# Patient Record
Sex: Female | Born: 1945 | Race: White | Hispanic: No | Smoking: Never smoker
Health system: Southern US, Community
[De-identification: ages and names within clinical notes are randomized; demographics above are authoritative.]

## PROBLEM LIST (undated history)

## (undated) DIAGNOSIS — F419 Anxiety disorder, unspecified: Secondary | ICD-10-CM

## (undated) DIAGNOSIS — H409 Unspecified glaucoma: Secondary | ICD-10-CM

## (undated) DIAGNOSIS — K219 Gastro-esophageal reflux disease without esophagitis: Secondary | ICD-10-CM

## (undated) DIAGNOSIS — K602 Anal fissure, unspecified: Secondary | ICD-10-CM

## (undated) DIAGNOSIS — D3501 Benign neoplasm of right adrenal gland: Secondary | ICD-10-CM

## (undated) DIAGNOSIS — M810 Age-related osteoporosis without current pathological fracture: Secondary | ICD-10-CM

## (undated) DIAGNOSIS — K635 Polyp of colon: Secondary | ICD-10-CM

## (undated) DIAGNOSIS — E079 Disorder of thyroid, unspecified: Secondary | ICD-10-CM

## (undated) DIAGNOSIS — I10 Essential (primary) hypertension: Secondary | ICD-10-CM

## (undated) DIAGNOSIS — J189 Pneumonia, unspecified organism: Secondary | ICD-10-CM

## (undated) DIAGNOSIS — K8020 Calculus of gallbladder without cholecystitis without obstruction: Secondary | ICD-10-CM

## (undated) DIAGNOSIS — M8580 Other specified disorders of bone density and structure, unspecified site: Secondary | ICD-10-CM

## (undated) DIAGNOSIS — D649 Anemia, unspecified: Secondary | ICD-10-CM

## (undated) HISTORY — PX: BLADDER NECK RECONSTRUCTION: SHX1239

## (undated) HISTORY — DX: Anemia, unspecified: D64.9

## (undated) HISTORY — DX: Anxiety disorder, unspecified: F41.9

## (undated) HISTORY — DX: Age-related osteoporosis without current pathological fracture: M81.0

## (undated) HISTORY — PX: LAPAROSCOPIC INTERNAL HERNIA REPAIR: SHX6502

## (undated) HISTORY — DX: Benign neoplasm of right adrenal gland: D35.01

## (undated) HISTORY — PX: CATARACT EXTRACTION, BILATERAL: SHX1313

## (undated) HISTORY — DX: Anal fissure, unspecified: K60.2

## (undated) HISTORY — DX: Pneumonia, unspecified organism: J18.9

## (undated) HISTORY — PX: INGUINAL HERNIA REPAIR: SUR1180

## (undated) HISTORY — PX: HERNIA REPAIR: SHX51

## (undated) HISTORY — DX: Gastro-esophageal reflux disease without esophagitis: K21.9

## (undated) HISTORY — DX: Polyp of colon: K63.5

## (undated) HISTORY — DX: Calculus of gallbladder without cholecystitis without obstruction: K80.20

## (undated) HISTORY — PX: BLADDER SUSPENSION: SHX72

---

## 1977-04-09 HISTORY — PX: ABDOMINAL HYSTERECTOMY: SHX81

## 1987-04-10 HISTORY — PX: VAGINAL PROLAPSE REPAIR: SHX830

## 1996-04-09 HISTORY — PX: CHOLECYSTECTOMY: SHX55

## 2011-07-25 ENCOUNTER — Other Ambulatory Visit: Payer: Self-pay | Admitting: Internal Medicine

## 2011-07-25 DIAGNOSIS — Z1231 Encounter for screening mammogram for malignant neoplasm of breast: Secondary | ICD-10-CM

## 2011-08-07 ENCOUNTER — Ambulatory Visit
Admission: RE | Admit: 2011-08-07 | Discharge: 2011-08-07 | Disposition: A | Discharge status: Home / Self Care | Payer: Medicare Other | Source: Ambulatory Visit | Attending: Internal Medicine | Admitting: Internal Medicine

## 2011-08-07 DIAGNOSIS — Z1231 Encounter for screening mammogram for malignant neoplasm of breast: Secondary | ICD-10-CM

## 2011-09-20 ENCOUNTER — Other Ambulatory Visit (HOSPITAL_COMMUNITY): Payer: Self-pay | Admitting: Internal Medicine

## 2011-09-20 DIAGNOSIS — M810 Age-related osteoporosis without current pathological fracture: Secondary | ICD-10-CM

## 2011-09-26 ENCOUNTER — Ambulatory Visit (HOSPITAL_COMMUNITY): Admission: RE | Admit: 2011-09-26 | Payer: Medicare Other | Source: Ambulatory Visit

## 2011-10-03 ENCOUNTER — Ambulatory Visit (HOSPITAL_COMMUNITY)
Admission: RE | Admit: 2011-10-03 | Discharge: 2011-10-03 | Disposition: A | Discharge status: Home / Self Care | Payer: Medicare Other | Source: Ambulatory Visit | Attending: Internal Medicine | Admitting: Internal Medicine

## 2011-10-03 DIAGNOSIS — Z780 Asymptomatic menopausal state: Secondary | ICD-10-CM | POA: Insufficient documentation

## 2011-10-03 DIAGNOSIS — Z13820 Encounter for screening for osteoporosis: Secondary | ICD-10-CM | POA: Insufficient documentation

## 2011-10-03 DIAGNOSIS — M810 Age-related osteoporosis without current pathological fracture: Secondary | ICD-10-CM

## 2011-10-13 ENCOUNTER — Emergency Department (HOSPITAL_COMMUNITY)
Admission: EM | Admit: 2011-10-13 | Discharge: 2011-10-13 | Disposition: A | Discharge status: Home / Self Care | Payer: Medicare Other | Attending: Emergency Medicine | Admitting: Emergency Medicine

## 2011-10-13 ENCOUNTER — Encounter (HOSPITAL_COMMUNITY): Payer: Self-pay | Admitting: Emergency Medicine

## 2011-10-13 ENCOUNTER — Emergency Department (HOSPITAL_COMMUNITY): Payer: Medicare Other

## 2011-10-13 DIAGNOSIS — M8580 Other specified disorders of bone density and structure, unspecified site: Secondary | ICD-10-CM | POA: Insufficient documentation

## 2011-10-13 DIAGNOSIS — L02419 Cutaneous abscess of limb, unspecified: Secondary | ICD-10-CM | POA: Insufficient documentation

## 2011-10-13 DIAGNOSIS — M810 Age-related osteoporosis without current pathological fracture: Secondary | ICD-10-CM | POA: Insufficient documentation

## 2011-10-13 DIAGNOSIS — L0390 Cellulitis, unspecified: Secondary | ICD-10-CM

## 2011-10-13 DIAGNOSIS — L03119 Cellulitis of unspecified part of limb: Secondary | ICD-10-CM | POA: Insufficient documentation

## 2011-10-13 HISTORY — DX: Essential (primary) hypertension: I10

## 2011-10-13 HISTORY — DX: Disorder of thyroid, unspecified: E07.9

## 2011-10-13 HISTORY — DX: Other specified disorders of bone density and structure, unspecified site: M85.80

## 2011-10-13 MED ORDER — NAPROXEN 250 MG PO TABS
375.0000 mg | ORAL_TABLET | Freq: Once | ORAL | Status: AC
  Administered 2011-10-13: 375 mg via ORAL
  Filled 2011-10-13: qty 2

## 2011-10-13 MED ORDER — CEPHALEXIN 250 MG PO CAPS: 250.0000 mg | ORAL_CAPSULE | Freq: Four times a day (QID) | ORAL | Status: AC

## 2011-10-13 MED ORDER — NAPROXEN 375 MG PO TABS: 375.0000 mg | ORAL_TABLET | Freq: Two times a day (BID) | ORAL | Status: DC

## 2011-10-13 MED ORDER — CEPHALEXIN 250 MG PO CAPS
250.0000 mg | ORAL_CAPSULE | Freq: Once | ORAL | Status: AC
  Administered 2011-10-13: 250 mg via ORAL
  Filled 2011-10-13: qty 1

## 2011-10-13 NOTE — ED Provider Notes (Signed)
History     CSN: [id number]  Arrival date & time 10/13/11  2028   First MD Initiated Contact with Patient 10/13/11 2215      Chief Complaint  Patient presents with  . Ankle Pain  . Ankle Injury    (Consider location/radiation/quality/duration/timing/severity/associated sxs/prior treatment) HPI Comments: Patient states her infant grandson when into her lateral right ankle with his walker.  Several days ago.  Since that time.  His skin has been red, swollen, and tender to the touch.  There was no breaking the skin at that she is aware of.  She is applying ice and taking over-the-counter ibuprofen, without much relief  Patient is a 66 y.o. female presenting with ankle pain and lower extremity injury. The history is provided by the patient.  Ankle Pain  The incident occurred more than 2 days ago. The incident occurred at home. The injury mechanism was a direct blow. The pain is present in the right ankle. The pain is at a severity of 3/10. The pain is mild. The pain has been constant since onset. Pertinent negatives include no numbness, no inability to bear weight, no loss of motion, no muscle weakness, no loss of sensation and no tingling.  Ankle Injury Pertinent negatives include no chills, fever, joint swelling, numbness, rash or weakness.    Past Medical History  Diagnosis Date  . Thyroid disease   . Hypertension   . Osteopenia     Past Surgical History  Procedure Date  . Abdominal hysterectomy     History reviewed. No pertinent family history.  History  Substance Use Topics  . Smoking status: Never Smoker   . Smokeless tobacco: Not on file  . Alcohol Use: No    OB History    Grav Para Term Preterm Abortions TAB SAB Ect Mult Living                  Review of Systems  Constitutional: Negative for fever and chills.  Musculoskeletal: Negative for joint swelling.  Skin: Positive for wound. Negative for pallor and rash.  Neurological: Negative for dizziness,  tingling, weakness and numbness.    Allergies  Review of patient's allergies indicates no known allergies.  Home Medications   Current Outpatient Rx  Name Route Sig Dispense Refill  . ATENOLOL 25 MG PO TABS Oral Take 25 mg by mouth daily.    Marland Kitchen CALCIUM + D PO Oral Take 2 tablets by mouth 2 (two) times daily.    Marland Kitchen HYDROCHLOROTHIAZIDE 25 MG PO TABS Oral Take 37.5 mg by mouth daily.    Marland Kitchen LEVOTHYROXINE SODIUM 50 MCG PO TABS Oral Take 50 mcg by mouth daily.    . CEPHALEXIN 250 MG PO CAPS Oral Take 1 capsule (250 mg total) by mouth 4 (four) times daily. 30 capsule 0  . NAPROXEN 375 MG PO TABS Oral Take 1 tablet (375 mg total) by mouth 2 (two) times daily. 30 tablet 0    BP 145/74  Pulse 74  Temp 98.4 F (36.9 C) (Oral)  Resp 18  SpO2 100%  Physical Exam  Constitutional: She appears well-developed and well-nourished.  HENT:  Head: Normocephalic.  Eyes: Pupils are equal, round, and reactive to light.  Neck: Normal range of motion.  Cardiovascular: Normal rate.   Musculoskeletal:       Lateral aspect of right foot, and ankle read, slightly swollen.  Full range of motion of the joint.  Distal pulses intact  Skin: Skin is warm. There is erythema.  ED Course  Procedures (including critical care time)  Labs Reviewed - No data to display Dg Ankle Complete Right  10/13/2011  *RADIOLOGY REPORT*  Clinical Data: Right ankle pain and swelling  RIGHT ANKLE - COMPLETE 3+ VIEW  Comparison: None.  Findings: Ankle mortise intact.  Talar dome is normal.  No malleolar fracture.  No joint effusion.  IMPRESSION: No ankle fracture.  Original Report Authenticated By: Genevive Bi, M.D.     1. Cellulitis       MDM   This appears to be cellulitis of the skin.  Patient has had the area outlined started on antibiotic.  She has been instructed to return if she has increased swelling, increased erythema.  Starts having a fever, or generalized myalgias.  She does have an appointment with her  primary care physician on Thursday, which is perfect.  Timing for reevaluation        Arman Filter, NP 10/13/11 2220  Arman Filter, NP 10/13/11 2220

## 2011-10-13 NOTE — ED Notes (Addendum)
Patient complaining of right ankle pain and swelling for the last five days; patient states that her ankle was hit with a walker several days ago.  Redness noted to lateral aspect of right ankle; patient felt that she twisted her ankle several days ago when walking on rocks in a parking lot on Monday.  Patient reports trying cold/warm compresses; provided little to no relief.

## 2011-10-18 NOTE — ED Provider Notes (Signed)
Medical screening examination/treatment/procedure(s) were performed by non-physician practitioner and as supervising physician I was immediately available for consultation/collaboration.  Cyndra Numbers, MD 10/18/11 [phone]

## 2011-10-31 ENCOUNTER — Inpatient Hospital Stay (HOSPITAL_COMMUNITY): Payer: Medicare Other

## 2011-10-31 ENCOUNTER — Inpatient Hospital Stay (HOSPITAL_COMMUNITY)
Admission: EM | Admit: 2011-10-31 | Discharge: 2011-11-01 | DRG: 640 | Disposition: A | Discharge status: Hospice | Payer: Medicare Other | Attending: Family Medicine | Admitting: Family Medicine

## 2011-10-31 ENCOUNTER — Emergency Department (HOSPITAL_COMMUNITY): Payer: Medicare Other

## 2011-10-31 ENCOUNTER — Encounter (HOSPITAL_COMMUNITY): Payer: Self-pay | Admitting: *Deleted

## 2011-10-31 DIAGNOSIS — T370X5A Adverse effect of sulfonamides, initial encounter: Secondary | ICD-10-CM | POA: Diagnosis present

## 2011-10-31 DIAGNOSIS — M8580 Other specified disorders of bone density and structure, unspecified site: Secondary | ICD-10-CM

## 2011-10-31 DIAGNOSIS — R4182 Altered mental status, unspecified: Secondary | ICD-10-CM

## 2011-10-31 DIAGNOSIS — Y92009 Unspecified place in unspecified non-institutional (private) residence as the place of occurrence of the external cause: Secondary | ICD-10-CM

## 2011-10-31 DIAGNOSIS — L02419 Cutaneous abscess of limb, unspecified: Secondary | ICD-10-CM | POA: Diagnosis present

## 2011-10-31 DIAGNOSIS — E876 Hypokalemia: Secondary | ICD-10-CM

## 2011-10-31 DIAGNOSIS — G92 Toxic encephalopathy: Secondary | ICD-10-CM | POA: Diagnosis present

## 2011-10-31 DIAGNOSIS — R7309 Other abnormal glucose: Secondary | ICD-10-CM | POA: Diagnosis present

## 2011-10-31 DIAGNOSIS — Z85118 Personal history of other malignant neoplasm of bronchus and lung: Secondary | ICD-10-CM

## 2011-10-31 DIAGNOSIS — G929 Unspecified toxic encephalopathy: Secondary | ICD-10-CM | POA: Diagnosis present

## 2011-10-31 DIAGNOSIS — N289 Disorder of kidney and ureter, unspecified: Secondary | ICD-10-CM

## 2011-10-31 DIAGNOSIS — E871 Hypo-osmolality and hyponatremia: Principal | ICD-10-CM

## 2011-10-31 DIAGNOSIS — M899 Disorder of bone, unspecified: Secondary | ICD-10-CM

## 2011-10-31 DIAGNOSIS — I10 Essential (primary) hypertension: Secondary | ICD-10-CM | POA: Diagnosis present

## 2011-10-31 HISTORY — DX: Unspecified glaucoma: H40.9

## 2011-10-31 LAB — COMPREHENSIVE METABOLIC PANEL
ALT: 15 U/L (ref 0–35)
AST: 23 U/L (ref 0–37)
Albumin: 3.2 g/dL — ABNORMAL LOW (ref 3.5–5.2)
Alkaline Phosphatase: 63 U/L (ref 39–117)
BUN: 12 mg/dL (ref 6–23)
CO2: 22 mEq/L (ref 19–32)
Calcium: 8.6 mg/dL (ref 8.4–10.5)
Chloride: 84 mEq/L — ABNORMAL LOW (ref 96–112)
Creatinine, Ser: 1.34 mg/dL — ABNORMAL HIGH (ref 0.50–1.10)
GFR calc Af Amer: 47 mL/min — ABNORMAL LOW (ref >90)
GFR calc non Af Amer: 41 mL/min — ABNORMAL LOW (ref >90)
Glucose, Bld: 147 mg/dL — ABNORMAL HIGH (ref 70–99)
Potassium: 2.4 mEq/L — CL (ref 3.5–5.1)
Sodium: 120 mEq/L — ABNORMAL LOW (ref 135–145)
Total Bilirubin: 0.2 mg/dL — ABNORMAL LOW (ref 0.3–1.2)
Total Protein: 7.1 g/dL (ref 6.0–8.3)

## 2011-10-31 LAB — MAGNESIUM: Magnesium: 1.7 mg/dL (ref 1.5–2.5)

## 2011-10-31 LAB — CBC WITH DIFFERENTIAL/PLATELET
Basophils Absolute: 0 10*3/uL (ref 0.0–0.1)
Basophils Relative: 1 % (ref 0–1)
Eosinophils Absolute: 0 10*3/uL (ref 0.0–0.7)
Eosinophils Relative: 1 % (ref 0–5)
HCT: 32.4 % — ABNORMAL LOW (ref 36.0–46.0)
Hemoglobin: 11.4 g/dL — ABNORMAL LOW (ref 12.0–15.0)
Lymphocytes Relative: 21 % (ref 12–46)
Lymphs Abs: 0.9 10*3/uL (ref 0.7–4.0)
MCH: 30.2 pg (ref 26.0–34.0)
MCHC: 35.2 g/dL (ref 30.0–36.0)
MCV: 85.9 fL (ref 78.0–100.0)
Monocytes Absolute: 0.4 10*3/uL (ref 0.1–1.0)
Monocytes Relative: 10 % (ref 3–12)
Neutro Abs: 3.1 10*3/uL (ref 1.7–7.7)
Neutrophils Relative %: 67 % (ref 43–77)
Platelets: 230 10*3/uL (ref 150–400)
RBC: 3.77 MIL/uL — ABNORMAL LOW (ref 3.87–5.11)
RDW: 13.2 % (ref 11.5–15.5)
WBC Morphology: INCREASED
WBC: 4.4 10*3/uL (ref 4.0–10.5)

## 2011-10-31 LAB — URINE MICROSCOPIC-ADD ON

## 2011-10-31 LAB — BASIC METABOLIC PANEL
BUN: 9 mg/dL (ref 6–23)
CO2: 21 mEq/L (ref 19–32)
Calcium: 8.6 mg/dL (ref 8.4–10.5)
Chloride: 94 mEq/L — ABNORMAL LOW (ref 96–112)
Creatinine, Ser: 1.05 mg/dL (ref 0.50–1.10)
GFR calc Af Amer: 63 mL/min — ABNORMAL LOW (ref >90)
GFR calc non Af Amer: 55 mL/min — ABNORMAL LOW (ref >90)
Glucose, Bld: 128 mg/dL — ABNORMAL HIGH (ref 70–99)
Potassium: 3.4 mEq/L — ABNORMAL LOW (ref 3.5–5.1)
Sodium: 128 mEq/L — ABNORMAL LOW (ref 135–145)

## 2011-10-31 LAB — URINALYSIS, ROUTINE W REFLEX MICROSCOPIC
Bilirubin Urine: NEGATIVE
Glucose, UA: NEGATIVE mg/dL
Hgb urine dipstick: NEGATIVE
Ketones, ur: NEGATIVE mg/dL
Nitrite: NEGATIVE
Protein, ur: NEGATIVE mg/dL
Specific Gravity, Urine: 1.009 (ref 1.005–1.030)
Urobilinogen, UA: 0.2 mg/dL (ref 0.0–1.0)
pH: 6.5 (ref 5.0–8.0)

## 2011-10-31 LAB — TSH: TSH: 1.597 u[IU]/mL (ref 0.350–4.500)

## 2011-10-31 LAB — OSMOLALITY: Osmolality: 265 mOsm/kg — ABNORMAL LOW (ref 275–300)

## 2011-10-31 LAB — CORTISOL: Cortisol, Plasma: 18.6 ug/dL

## 2011-10-31 LAB — VITAMIN B12: Vitamin B-12: 415 pg/mL (ref 211–911)

## 2011-10-31 LAB — SEDIMENTATION RATE: Sed Rate: 42 mm/hr — ABNORMAL HIGH (ref 0–22)

## 2011-10-31 MED ORDER — POTASSIUM CHLORIDE 10 MEQ/100ML IV SOLN
10.0000 meq | INTRAVENOUS | Status: AC
  Administered 2011-10-31 (×3): 10 meq via INTRAVENOUS
  Filled 2011-10-31: qty 300
  Filled 2011-10-31: qty 100

## 2011-10-31 MED ORDER — SODIUM CHLORIDE 0.9 % IV SOLN
INTRAVENOUS | Status: DC
  Administered 2011-10-31: 07:00:00 via INTRAVENOUS

## 2011-10-31 MED ORDER — DEXTROSE 5 % IV SOLN
1.0000 g | Freq: Once | INTRAVENOUS | Status: AC
  Administered 2011-11-01: 1 g via INTRAVENOUS
  Filled 2011-10-31: qty 10

## 2011-10-31 MED ORDER — CEPHALEXIN 500 MG PO CAPS
500.0000 mg | ORAL_CAPSULE | Freq: Two times a day (BID) | ORAL | Status: DC
  Administered 2011-11-01: 500 mg via ORAL
  Filled 2011-10-31 (×2): qty 1

## 2011-10-31 MED ORDER — ATENOLOL 25 MG PO TABS
25.0000 mg | ORAL_TABLET | Freq: Every day | ORAL | Status: DC
  Administered 2011-10-31 – 2011-11-01 (×2): 25 mg via ORAL
  Filled 2011-10-31 (×3): qty 1

## 2011-10-31 MED ORDER — LOSARTAN POTASSIUM 50 MG PO TABS
50.0000 mg | ORAL_TABLET | Freq: Every day | ORAL | Status: DC
  Administered 2011-10-31 – 2011-11-01 (×2): 50 mg via ORAL
  Filled 2011-10-31 (×3): qty 1

## 2011-10-31 MED ORDER — POTASSIUM CHLORIDE CRYS ER 20 MEQ PO TBCR
40.0000 meq | EXTENDED_RELEASE_TABLET | Freq: Once | ORAL | Status: AC
  Administered 2011-10-31: 40 meq via ORAL
  Filled 2011-10-31: qty 2

## 2011-10-31 MED ORDER — DEXTROSE 5 % IV SOLN
1.0000 g | INTRAVENOUS | Status: DC
  Administered 2011-10-31: 1 g via INTRAVENOUS
  Filled 2011-10-31 (×2): qty 10

## 2011-10-31 MED ORDER — LEVOTHYROXINE SODIUM 50 MCG PO TABS
50.0000 ug | ORAL_TABLET | Freq: Every day | ORAL | Status: DC
  Administered 2011-10-31 – 2011-11-01 (×2): 50 ug via ORAL
  Filled 2011-10-31 (×4): qty 1

## 2011-10-31 NOTE — ED Notes (Signed)
hospitalist at bedside

## 2011-10-31 NOTE — ED Provider Notes (Signed)
History     CSN: [id number]  Arrival date & time 10/31/11  0050   First MD Initiated Contact with Patient 10/31/11 0150      Chief Complaint  Patient presents with  . Fever    (Consider location/radiation/quality/duration/timing/severity/associated sxs/prior treatment) HPI 66 yo female presents to the ER with her daughter and grandson with report of altered mental status, fevers, lethargy.  Pt lives alone, seen last week by grandson who thought she was less active than usual.  Daughter spoke to her on the phone tonight and thought she sounded confused, lethargic, out of it.  Daughter drove hour to see patient, and brought her to the ER.  Pt without h/o dementia or other cognitive problems.  Pt was seen 3 week ago in ED after bumping her right ankle on taxi, dx with cellultis and placed on keflex.  PCM changed to bactrim.  Over the last few days has had temps to 101 axillary.  No sob, cough, chest pain.  No urinary symptoms.  No abdominal pain.  Cellulitis area still discolored, but not painful.  Pt feels that she hasn't been eating/drinking well.  Daughter feels that patient is "breathing funny"   Past Medical History  Diagnosis Date  . Thyroid disease   . Hypertension   . Osteopenia   . Glaucoma     Past Surgical History  Procedure Date  . Abdominal hysterectomy 1979  . Vaginal prolapse repair 1989  . Hernia repair 2003, 2007  . Cholecystectomy 1998  . Bladder suspension     x2    Family History  Problem Relation Age of Onset  . Hypertension Mother     History  Substance Use Topics  . Smoking status: Never Smoker   . Smokeless tobacco: Never Used  . Alcohol Use: No    OB History    Grav Para Term Preterm Abortions TAB SAB Ect Mult Living                  Review of Systems  Unable to perform ROS: Mental status change    Allergies  Ace inhibitors and Bactrim  Home Medications  No current outpatient prescriptions on file.  BP 104/69  Pulse 75  Temp  100.1 F (37.8 C) (Other (Comment))  Resp 18  Ht 5' (1.524 m)  Wt 171 lb 14.4 oz (77.973 kg)  BMI 33.57 kg/m2  SpO2 97%  Physical Exam  Nursing note and vitals reviewed. Constitutional: She is oriented to person, place, and time. She appears well-developed and well-nourished.  HENT:  Head: Normocephalic and atraumatic.  Nose: Nose normal.       Dry mucous membranes  Eyes: Conjunctivae and EOM are normal. Pupils are equal, round, and reactive to light.  Neck: Normal range of motion. Neck supple. No JVD present. No tracheal deviation present. No thyromegaly present.  Cardiovascular: Normal rate, regular rhythm, normal heart sounds and intact distal pulses.  Exam reveals no gallop and no friction rub.   No murmur heard. Pulmonary/Chest: Effort normal and breath sounds normal. No stridor. No respiratory distress. She has no wheezes. She has no rales. She exhibits no tenderness.  Abdominal: Soft. Bowel sounds are normal. She exhibits no distension and no mass. There is no tenderness. There is no rebound and no guarding.  Musculoskeletal: Normal range of motion. She exhibits no edema and no tenderness.  Lymphadenopathy:    She has no cervical adenopathy.  Neurological: She is alert and oriented to person, place, and time. She  has normal reflexes. No cranial nerve deficit. She exhibits normal muscle tone. Coordination normal.       Oriented but confused  Skin: Skin is dry. No rash noted. No erythema. No pallor.       Skin changes on right ankle c/w recent cellullitis, no warmth or active signs of infection  Psychiatric: She has a normal mood and affect. Her behavior is normal.    ED Course  Procedures (including critical care time)  Labs Reviewed  URINALYSIS, ROUTINE W REFLEX MICROSCOPIC - Abnormal; Notable for the following:    Leukocytes, UA TRACE (*)     All other components within normal limits  CBC WITH DIFFERENTIAL - Abnormal; Notable for the following:    RBC 3.77 (*)      Hemoglobin 11.4 (*)     HCT 32.4 (*)     All other components within normal limits  COMPREHENSIVE METABOLIC PANEL - Abnormal; Notable for the following:    Sodium 120 (*)  REPEATED TO VERIFY   Potassium 2.4 (*)     Chloride 84 (*)     Glucose, Bld 147 (*)     Creatinine, Ser 1.34 (*)     Albumin 3.2 (*)     Total Bilirubin 0.2 (*)     GFR calc non Af Amer 41 (*)     GFR calc Af Amer 47 (*)     All other components within normal limits  BASIC METABOLIC PANEL - Abnormal; Notable for the following:    Sodium 128 (*)     Potassium 3.4 (*)     Chloride 94 (*)     Glucose, Bld 128 (*)     GFR calc non Af Amer 55 (*)     GFR calc Af Amer 63 (*)     All other components within normal limits  OSMOLALITY - Abnormal; Notable for the following:    Osmolality 265 (*)     All other components within normal limits  SEDIMENTATION RATE - Abnormal; Notable for the following:    Sed Rate 42 (*)     All other components within normal limits  TSH  URINE MICROSCOPIC-ADD ON  CORTISOL  MAGNESIUM  VITAMIN B12  FOLATE RBC  RPR  ANA   Dg Chest 2 View  10/31/2011  *RADIOLOGY REPORT*  Clinical Data: Shortness of breath.  CHEST - 2 VIEW  Comparison: None.  Findings: Cardiac and mediastinal contours appear normal.  The lungs appear clear.  No pleural effusion is identified.  Mild thoracic spondylosis noted.  IMPRESSION:  1.  Mild thoracic spondylosis.  No acute findings.  Original Report Authenticated By: Dellia Cloud, M.D.   Ct Head Wo Contrast  10/31/2011  *RADIOLOGY REPORT*  Clinical Data: Dysarthria with altered mental status. Hyponatremia.  CT HEAD WITHOUT CONTRAST  Technique:  Contiguous axial images were obtained from the base of the skull through the vertex without contrast.  Comparison: None.  Findings: There is no evidence for acute infarction, intracranial hemorrhage, mass lesion, hydrocephalus, or extra-axial fluid. There is no significant atrophy or white matter disease.  No CT signs  of proximal vascular thrombosis.  Calvarium intact.  Clear sinuses and mastoids.  IMPRESSION: Negative exam.  Original Report Authenticated By: Elsie Stain, M.D.     1. Hyponatremia   2. Hypokalemia   3. Renal insufficiency   4. Altered mental status   5. Osteopenia       MDM  66 yo female with confusion, recent abx.  Concern for encephalopathy, possible uremia given recent abx use, dehydration.  Will get labs, give ivf.        Olivia Mackie, MD 10/31/11 2105

## 2011-10-31 NOTE — ED Notes (Signed)
Pt has multiple complaints,  One is right ankle redness which she has been taking antibiotics for, the other she would like to know if she is dehydrated and if her "sugar" is low,  Daughter at bedside says her mother seems confused/ not herself,  Pt alert and oriented times 4,  Complaints of headache only when she has a temperature,  Denies any falls or injuries.

## 2011-10-31 NOTE — Progress Notes (Addendum)
3:02 PM I agree with HPI/GPe and A/P per Dr. Pearson Grippe   Patient feels much better.  Doesn;t recall some of the issues overnight.  Has never had this happen before.  Wants to know when she can go home.      HEENT-no pallor/ict.  Throat clear, good dentition CHEST-clear.  No added sound CARDIAC-s1 s2 no m/r/g ABDOMEN-soft, NT,ND, NO rebound/Gaurd NEURO-CN 2-12 intact Motor intact Sensation intact SKIN/MUSCULAR-Slightly warm and swollen area over R ankle.> L.  No drainage or seepage.  Per patient appears better    Patient Active Problem List  Diagnosis  . Osteopenia  . Hyponatremia  . Hypokalemia    P-D/c IV Ceftriaxone.  Convert to Keflex for 10 days Electrolyte dyscrasias potentially from bactrim-will list as allergy Ct neg, and given no Neuro findings on exam, no further work-up necessary.  Called daughter Marylene Land on cell phone-no answer   Pleas Koch, MD [facility] [phone]

## 2011-10-31 NOTE — ED Notes (Signed)
Pt c/o right ankle redness; states was getting out a taxi a month ago and scraped her right ankle;  tx at [facility]--"sepsis" per daughter and given cephalexin; pcp gave pt bactrim. Pt started running a low grade fever two days ago.  Daughter states pt seems to be lethargic/not eating per normal; ankle continues to be red

## 2011-10-31 NOTE — Progress Notes (Signed)
Angie daughters number [phone]

## 2011-10-31 NOTE — Progress Notes (Deleted)
   CARE MANAGEMENT NOTE 10/31/2011  Patient:  Alexandra Cruz   Account Number:  [id number]  Date Initiated:  10/31/2011  Documentation initiated by:  [facility]  Subjective/Objective Assessment:   66 year old female wit HX lung cancer and admiited with AMS.     Action/Plan:   Pt will be discharged home with hospice services.   Anticipated DC Date:  11/03/2011   Anticipated DC Plan:  HOME W [facility] CARE         Choice offered to / List presented to:  C-4 Adult Children           HH agency  [facility] [place]   Comments:  10/31/11  Spoke with spouse who referred me to daughter Jorge Ny to decide on hospice agency. Windell Moulding chose [facility]. Margie made aware of this.

## 2011-10-31 NOTE — Progress Notes (Signed)
Pt allergic to ace inhibitors, bactrim

## 2011-10-31 NOTE — H&P (Signed)
Alexandra Cruz is an 66 y.o. female.    Pcp:  [facility]  Chief Complaint: weak HPI: 66 yo female with hx of htn, hypothyroidism has had recent cellulitis and tx with keflex 10/14/2011  and then treated started on Bactrim 10/18/2011 , which she finished a few days ago.  Pt apparently started to have low grade fever yesterday, and then today appeared confused, or had altered ms.  Pt denies diarrhea, n/v, brbpr, black stool dysuria, hematuria.    In ED, pt found to be severly hyponatremic, and hypokalemic.  Pt willl be admitted for hyponatremia, hypokalemia, and also altered ms.   Past Medical History  Diagnosis Date  . Thyroid disease   . Hypertension   . Osteopenia   . Glaucoma     Past Surgical History  Procedure Date  . Abdominal hysterectomy 1979  . Vaginal prolapse repair 1989  . Hernia repair 2003, 2007  . Cholecystectomy 1998  . Bladder suspension     x2    Family History  Problem Relation Age of Onset  . Hypertension Mother    Social History:  reports that she has never smoked. She has never used smokeless tobacco. She reports that she does not drink alcohol or use illicit drugs.  Allergies: No Known Allergies   (Not in a hospital admission)  Results for orders placed during the hospital encounter of 10/31/11 (from the past 48 hour(s))  CBC WITH DIFFERENTIAL     Status: Abnormal   Collection Time   10/31/11  1:42 AM      Component Value Range Comment   WBC 4.4  4.0 - 10.5 K/uL    RBC 3.77 (*) 3.87 - 5.11 MIL/uL    Hemoglobin 11.4 (*) 12.0 - 15.0 g/dL    HCT 16.1 (*) 09.6 - 46.0 %    MCV 85.9  78.0 - 100.0 fL    MCH 30.2  26.0 - 34.0 pg    MCHC 35.2  30.0 - 36.0 g/dL    RDW 04.5  40.9 - 81.1 %    Platelets 230  150 - 400 K/uL    Neutrophils Relative 67  43 - 77 %    Lymphocytes Relative 21  12 - 46 %    Monocytes Relative 10  3 - 12 %    Eosinophils Relative 1  0 - 5 %    Basophils Relative 1  0 - 1 %    Neutro Abs 3.1  1.7 - 7.7 K/uL    Lymphs Abs  0.9  0.7 - 4.0 K/uL    Monocytes Absolute 0.4  0.1 - 1.0 K/uL    Eosinophils Absolute 0.0  0.0 - 0.7 K/uL    Basophils Absolute 0.0  0.0 - 0.1 K/uL    WBC Morphology INCREASED BANDS (>20% BANDS)     COMPREHENSIVE METABOLIC PANEL     Status: Abnormal   Collection Time   10/31/11  1:42 AM      Component Value Range Comment   Sodium 120 (*) 135 - 145 mEq/L REPEATED TO VERIFY   Potassium 2.4 (*) 3.5 - 5.1 mEq/L    Chloride 84 (*) 96 - 112 mEq/L    CO2 22  19 - 32 mEq/L    Glucose, Bld 147 (*) 70 - 99 mg/dL    BUN 12  6 - 23 mg/dL    Creatinine, Ser 9.14 (*) 0.50 - 1.10 mg/dL    Calcium 8.6  8.4 - 78.2 mg/dL    Total  Protein 7.1  6.0 - 8.3 g/dL    Albumin 3.2 (*) 3.5 - 5.2 g/dL    AST 23  0 - 37 U/L    ALT 15  0 - 35 U/L    Alkaline Phosphatase 63  39 - 117 U/L    Total Bilirubin 0.2 (*) 0.3 - 1.2 mg/dL    GFR calc non Af Amer 41 (*) >90 mL/min    GFR calc Af Amer 47 (*) >90 mL/min   URINALYSIS, ROUTINE W REFLEX MICROSCOPIC     Status: Abnormal   Collection Time   10/31/11  2:58 AM      Component Value Range Comment   Color, Urine YELLOW  YELLOW    APPearance CLEAR  CLEAR    Specific Gravity, Urine 1.009  1.005 - 1.030    pH 6.5  5.0 - 8.0    Glucose, UA NEGATIVE  NEGATIVE mg/dL    Hgb urine dipstick NEGATIVE  NEGATIVE    Bilirubin Urine NEGATIVE  NEGATIVE    Ketones, ur NEGATIVE  NEGATIVE mg/dL    Protein, ur NEGATIVE  NEGATIVE mg/dL    Urobilinogen, UA 0.2  0.0 - 1.0 mg/dL    Nitrite NEGATIVE  NEGATIVE    Leukocytes, UA TRACE (*) NEGATIVE   URINE MICROSCOPIC-ADD ON     Status: Normal   Collection Time   10/31/11  2:58 AM      Component Value Range Comment   Squamous Epithelial / LPF RARE  RARE    WBC, UA 0-2  <3 WBC/hpf    Bacteria, UA RARE  RARE    Dg Chest 2 View  10/31/2011  *RADIOLOGY REPORT*  Clinical Data: Shortness of breath.  CHEST - 2 VIEW  Comparison: None.  Findings: Cardiac and mediastinal contours appear normal.  The lungs appear clear.  No pleural effusion is  identified.  Mild thoracic spondylosis noted.  IMPRESSION:  1.  Mild thoracic spondylosis.  No acute findings.  Original Report Authenticated By: Dellia Cloud, M.D.    Review of Systems  Constitutional: Positive for fever and weight loss. Negative for chills, malaise/fatigue and diaphoresis.       Lost 10 lbs in 2months, went on diet.   HENT: Negative for hearing loss, ear pain, nosebleeds, congestion, neck pain, tinnitus and ear discharge.   Eyes: Negative for blurred vision, double vision, photophobia, pain, discharge and redness.  Respiratory: Negative for cough, hemoptysis, sputum production, shortness of breath, wheezing and stridor.   Cardiovascular: Negative for chest pain, palpitations, orthopnea, claudication, leg swelling and PND.  Gastrointestinal: Negative for heartburn, nausea, vomiting, abdominal pain, diarrhea, constipation, blood in stool and melena.  Genitourinary: Negative for dysuria, urgency, frequency, hematuria and flank pain.  Musculoskeletal: Negative for myalgias, back pain, joint pain and falls.  Skin: Positive for rash. Negative for itching.  Neurological: Negative for dizziness, tingling, tremors, sensory change, speech change, focal weakness, seizures, weakness and headaches.  Endo/Heme/Allergies: Negative for environmental allergies and polydipsia. Does not bruise/bleed easily.  Psychiatric/Behavioral: Negative for depression, suicidal ideas, hallucinations and substance abuse. The patient is not nervous/anxious and does not have insomnia.     Blood pressure 119/59, pulse 80, temperature 99.3 F (37.4 C), resp. rate 20, SpO2 100.00%. Physical Exam  Constitutional: She is oriented to person, place, and time. She appears well-developed and well-nourished. No distress.  HENT:  Head: Normocephalic and atraumatic.  Mouth/Throat: No oropharyngeal exudate.  Eyes: Conjunctivae and EOM are normal. Pupils are equal, round, and reactive to light.  Right eye  exhibits no discharge. Left eye exhibits no discharge. No scleral icterus.  Neck: Normal range of motion. Neck supple. No JVD present. No tracheal deviation present. No thyromegaly present.  Cardiovascular: Normal rate, regular rhythm and normal heart sounds.  Exam reveals no gallop and no friction rub.   No murmur heard. Respiratory: Effort normal and breath sounds normal. No stridor. No respiratory distress. She has no wheezes. She has no rales. She exhibits no tenderness.  GI: Soft. Bowel sounds are normal. She exhibits no distension and no mass. There is no tenderness. There is no rebound and no guarding.  Musculoskeletal: Normal range of motion. She exhibits no edema and no tenderness.  Lymphadenopathy:    She has no cervical adenopathy.  Neurological: She is alert and oriented to person, place, and time. She has normal reflexes. She displays normal reflexes. No cranial nerve deficit. She exhibits normal muscle tone. Coordination normal.  Skin: Skin is warm and dry. No rash noted. She is not diaphoretic. No erythema. No pallor.     Assessment/Plan AMS:  Check CT brain noncontrast,  Check b12, folate, esr, ana, rpr, tsh Hyponatremia: serum osm, tsh, cortisol, urine sodium, urine osm Hypokalemia:  This may be related to her hydrochlorothiazide Cellulitis nearly resolved, tx with rocephin 1gm iv qday Adrenal adenoma, can try to expand database in am Hypothyroidism: check tsh Hypertension: hold on hydrochlorothiazide,  Start on losartan 50mg  po qday    Pearson Grippe 10/31/2011, 4:28 AM

## 2011-11-01 DIAGNOSIS — G929 Unspecified toxic encephalopathy: Secondary | ICD-10-CM

## 2011-11-01 DIAGNOSIS — G92 Toxic encephalopathy: Secondary | ICD-10-CM

## 2011-11-01 DIAGNOSIS — R7989 Other specified abnormal findings of blood chemistry: Secondary | ICD-10-CM

## 2011-11-01 LAB — BASIC METABOLIC PANEL
BUN: 13 mg/dL (ref 6–23)
CO2: 23 mEq/L (ref 19–32)
Calcium: 8.8 mg/dL (ref 8.4–10.5)
Chloride: 100 mEq/L (ref 96–112)
Creatinine, Ser: 1.03 mg/dL (ref 0.50–1.10)
GFR calc Af Amer: 65 mL/min — ABNORMAL LOW (ref >90)
GFR calc non Af Amer: 56 mL/min — ABNORMAL LOW (ref >90)
Glucose, Bld: 155 mg/dL — ABNORMAL HIGH (ref 70–99)
Potassium: 3.7 mEq/L (ref 3.5–5.1)
Sodium: 134 mEq/L — ABNORMAL LOW (ref 135–145)

## 2011-11-01 LAB — RPR: RPR Ser Ql: NONREACTIVE

## 2011-11-01 LAB — ANA: Anti Nuclear Antibody(ANA): NEGATIVE

## 2011-11-01 LAB — FOLATE RBC: RBC Folate: 987 ng/mL — ABNORMAL HIGH (ref >=366)

## 2011-11-01 MED ORDER — DOXYCYCLINE HYCLATE 50 MG PO CAPS: 100.0000 mg | ORAL_CAPSULE | Freq: Two times a day (BID) | ORAL | Status: AC

## 2011-11-01 NOTE — Discharge Summary (Signed)
Physician Discharge Summary  Alexandra Cruz ZOX:[id number] DOB: 1946-02-20 DOA: 10/31/2011  PCP: No primary provider on file.  Admit date: 10/31/2011 Discharge date: 11/01/2011  Recommendations for Outpatient Follow-up:  1. Get a wound check in 5-7 days at PCP office 2. Repeat CBC/Bmet in about 5-7 days  3. Reevaluate blood pressure and meds as an outpatient 4. Get HbA1c  Discharge Diagnoses:  Principal Problem:  *Hyponatremia Active Problems:  Hypokalemia   1. Cellulitis  Discharge Condition: Good  Diet recommendation: NOrmal  History of present illness:  66 yo female with hx of htn, hypothyroidism has had recent cellulitis and tx with keflex 10/14/2011 and then treated started on Bactrim 10/18/2011 , which she finished a few days ago. Pt apparently started to have low grade fever yesterday, and then today appeared confused, or had altered ms. Pt denied diarrhea, n/v, brbpr, black stool dysuria, hematuria on admission.  She was found to have significant electrolyte abnormalities and was admitted for the same please see below   Hospital Course:  Acute metabolic encephalopathy-secondary to side effects of Bactrim.  Have listed this as an allergy.  Totally resolved on discharge.  Workup unrevealing for cerebrovascular disease  Cellulitis-spoke with Dr. Ninetta Lights of infectious disease.  Based on the fact that she had clinical improvement in terms of decreased warmth to that area, with IV Rocephin, I will place patient on doxycycline for an extended course of 10 more days.  She has been given a prescription for the same.  She is to present herself to her primary care physician in 3-5 days for wound check and CBC.  Patient understands this-discussed this also with her daughter at her request  Elevated blood sugar without diagnosis of diabetes-recommend A1c as an outpatient.  In-hospital blood sugars have been less than 180  Hypertension-blood pressure medications may need to be adjusted as  an outpatient as her pressures and normal low-normal on her current regimen.  Patient was not symptomatic  Procedures:  CT scan head was negative  Consultations:  Telephone consulted Dr. Ninetta Lights of infectious disease  Discharge Exam: Filed Vitals:   11/01/11 1349  BP: 98/64  Pulse: 70  Temp: 98.5 F (36.9 C)  Resp: 20   Filed Vitals:   10/31/11 2030 11/01/11 0651 11/01/11 0743 11/01/11 1349  BP:  115/74  98/64  Pulse:  65  70  Temp: 98.5 F (36.9 C) 98.2 F (36.8 C)  98.5 F (36.9 C)  TempSrc: Oral Oral  Oral  Resp:  18  20  Height:      Weight:   77.8 kg (171 lb 8.3 oz)   SpO2:  94%  98%   HEENT-no pallor/ict. Throat clear, good dentition  CHEST-clear. No added sound  CARDIAC-s1 s2 no m/r/g  ABDOMEN-soft, NT,ND, NO rebound/Gaurd  NEURO-CN 2-12 intact  Motor intact  Sensation intact  SKIN/MUSCULAR-Slightly warm and swollen area over R ankle> Left No drainage or seepage.Lateral Malleolus has some denudation   Discharge Instructions   Medication List  As of 11/01/2011  4:01 PM   STOP taking these medications         cephALEXin 250 MG capsule      sulfamethoxazole-trimethoprim 800-160 MG per tablet         TAKE these medications         atenolol 25 MG tablet   Commonly known as: TENORMIN   Take 25 mg by mouth daily.      CALCIUM + D PO   Take 2 tablets by mouth  2 (two) times daily.      doxycycline 50 MG capsule   Commonly known as: VIBRAMYCIN   Take 2 capsules (100 mg total) by mouth 2 (two) times daily.      hydrochlorothiazide 25 MG tablet   Commonly known as: HYDRODIURIL   Take 37.5 mg by mouth daily.      levothyroxine 50 MCG tablet   Commonly known as: SYNTHROID, LEVOTHROID   Take 50 mcg by mouth daily.              The results of significant diagnostics from this hospitalization (including imaging, microbiology, ancillary and laboratory) are listed below for reference.    Significant Diagnostic Studies: Dg Chest 2  View  10/31/2011  *RADIOLOGY REPORT*  Clinical Data: Shortness of breath.  CHEST - 2 VIEW  Comparison: None.  Findings: Cardiac and mediastinal contours appear normal.  The lungs appear clear.  No pleural effusion is identified.  Mild thoracic spondylosis noted.  IMPRESSION:  1.  Mild thoracic spondylosis.  No acute findings.  Original Report Authenticated By: Dellia Cloud, M.D.   Dg Ankle Complete Right  10/13/2011  *RADIOLOGY REPORT*  Clinical Data: Right ankle pain and swelling  RIGHT ANKLE - COMPLETE 3+ VIEW  Comparison: None.  Findings: Ankle mortise intact.  Talar dome is normal.  No malleolar fracture.  No joint effusion.  IMPRESSION: No ankle fracture.  Original Report Authenticated By: Genevive Bi, M.D.   Ct Head Wo Contrast  10/31/2011  *RADIOLOGY REPORT*  Clinical Data: Dysarthria with altered mental status. Hyponatremia.  CT HEAD WITHOUT CONTRAST  Technique:  Contiguous axial images were obtained from the base of the skull through the vertex without contrast.  Comparison: None.  Findings: There is no evidence for acute infarction, intracranial hemorrhage, mass lesion, hydrocephalus, or extra-axial fluid. There is no significant atrophy or white matter disease.  No CT signs of proximal vascular thrombosis.  Calvarium intact.  Clear sinuses and mastoids.  IMPRESSION: Negative exam.  Original Report Authenticated By: Elsie Stain, M.D.   Dg Bone Density  10/03/2011  The Bone Mineral Densitometry hard-copy report (which includes all data, graphical display, and FRAX results when applicable) has been sent directly to the ordering physician.  This report can also be obtained electronically by viewing images for this exam through the performing facility's EMR, or by logging directly into [employer].  Original Report Authenticated By: Danae Orleans, M.D.    Microbiology: No results found for this or any previous visit (from the past 240 hour(s)).   Labs: Basic Metabolic  Panel:  Lab 11/01/11 0910 10/31/11 0946 10/31/11 0142  NA 134* 128* 120*  K 3.7 3.4* 2.4*  CL 100 94* 84*  CO2 23 21 22   GLUCOSE 155* 128* 147*  BUN 13 9 12   CREATININE 1.03 1.05 1.34*  CALCIUM 8.8 8.6 8.6  MG -- 1.7 --  PHOS -- -- --   Liver Function Tests:  Lab 10/31/11 0142  AST 23  ALT 15  ALKPHOS 63  BILITOT 0.2*  PROT 7.1  ALBUMIN 3.2*   No results found for this basename: LIPASE:5,AMYLASE:5 in the last 168 hours No results found for this basename: AMMONIA:5 in the last 168 hours CBC:  Lab 10/31/11 0142  WBC 4.4  NEUTROABS 3.1  HGB 11.4*  HCT 32.4*  MCV 85.9  PLT 230   Cardiac Enzymes: No results found for this basename: CKTOTAL:5,CKMB:5,CKMBINDEX:5,TROPONINI:5 in the last 168 hours BNP: BNP (last 3 results) No results found  for this basename: PROBNP:3 in the last 8760 hours CBG: No results found for this basename: GLUCAP:5 in the last 168 hours  Time coordinating discharge: 40 minutes  Signed:  Rhetta Mura  [facility] 11/01/2011, 4:01 PM

## 2011-11-01 NOTE — Progress Notes (Signed)
Patient discharged home in stable condition. Discharge instructions given to patient and family member with verbal feedback and understanding.

## 2012-06-20 ENCOUNTER — Other Ambulatory Visit: Payer: Self-pay | Admitting: Internal Medicine

## 2012-06-20 DIAGNOSIS — Z1231 Encounter for screening mammogram for malignant neoplasm of breast: Secondary | ICD-10-CM

## 2012-08-07 ENCOUNTER — Ambulatory Visit
Admission: RE | Admit: 2012-08-07 | Discharge: 2012-08-07 | Disposition: A | Discharge status: Home / Self Care | Payer: Medicare Other | Source: Ambulatory Visit | Attending: Internal Medicine | Admitting: Internal Medicine

## 2012-08-07 DIAGNOSIS — Z1231 Encounter for screening mammogram for malignant neoplasm of breast: Secondary | ICD-10-CM

## 2012-11-13 ENCOUNTER — Other Ambulatory Visit (HOSPITAL_COMMUNITY): Payer: Self-pay | Admitting: Internal Medicine

## 2012-11-13 DIAGNOSIS — Z13820 Encounter for screening for osteoporosis: Secondary | ICD-10-CM

## 2012-11-17 ENCOUNTER — Ambulatory Visit (HOSPITAL_COMMUNITY)
Admission: RE | Admit: 2012-11-17 | Discharge: 2012-11-17 | Disposition: A | Discharge status: Home / Self Care | Payer: Medicare Other | Source: Ambulatory Visit | Attending: Internal Medicine | Admitting: Internal Medicine

## 2012-11-17 DIAGNOSIS — Z13820 Encounter for screening for osteoporosis: Secondary | ICD-10-CM

## 2013-07-16 IMAGING — CR DG CHEST 2V
2 series · 2 of 2 positions shown · non-contrast
Comparison: None.

CLINICAL DATA: Shortness of breath.

CHEST - 2 VIEW

[w chest pa]
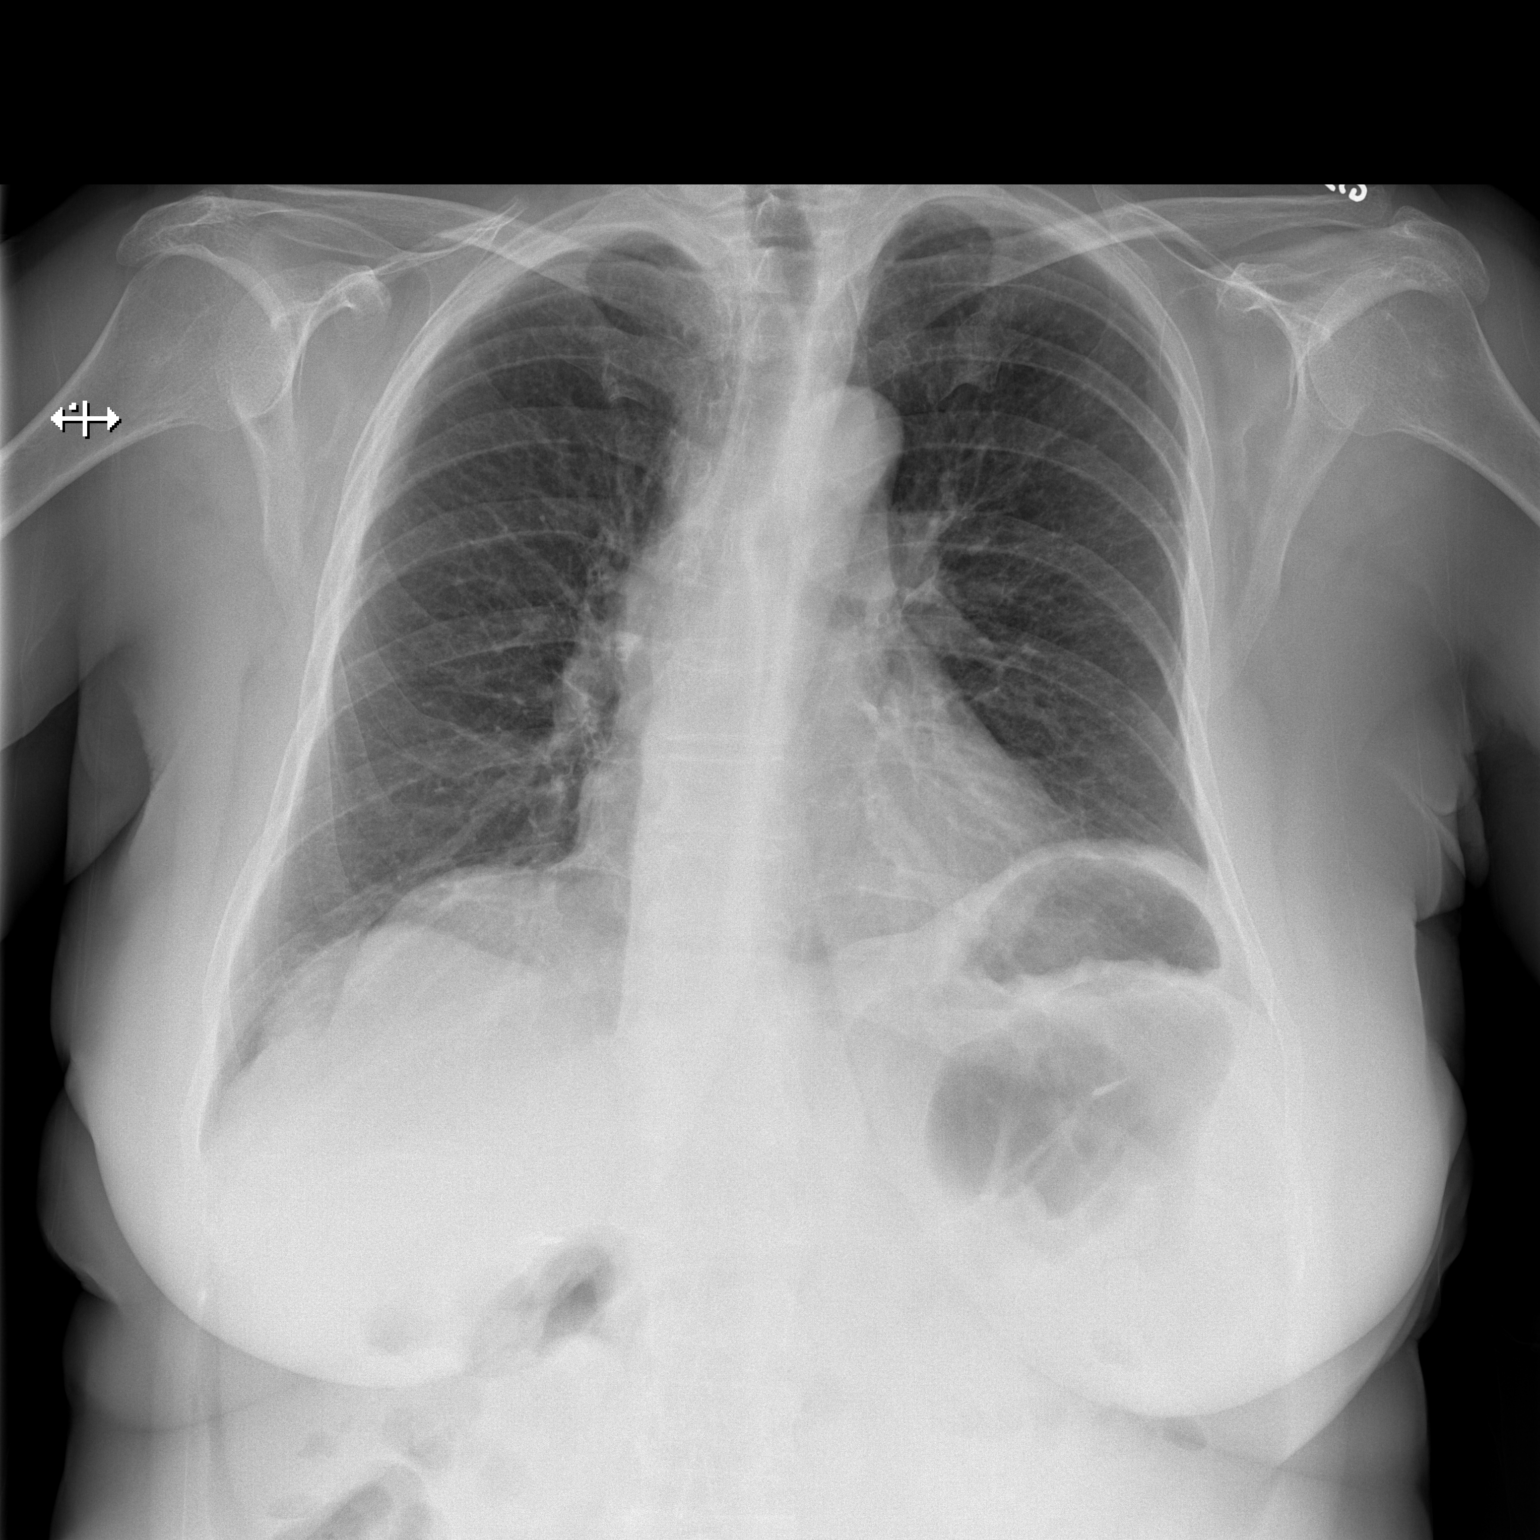

[w chest lat]
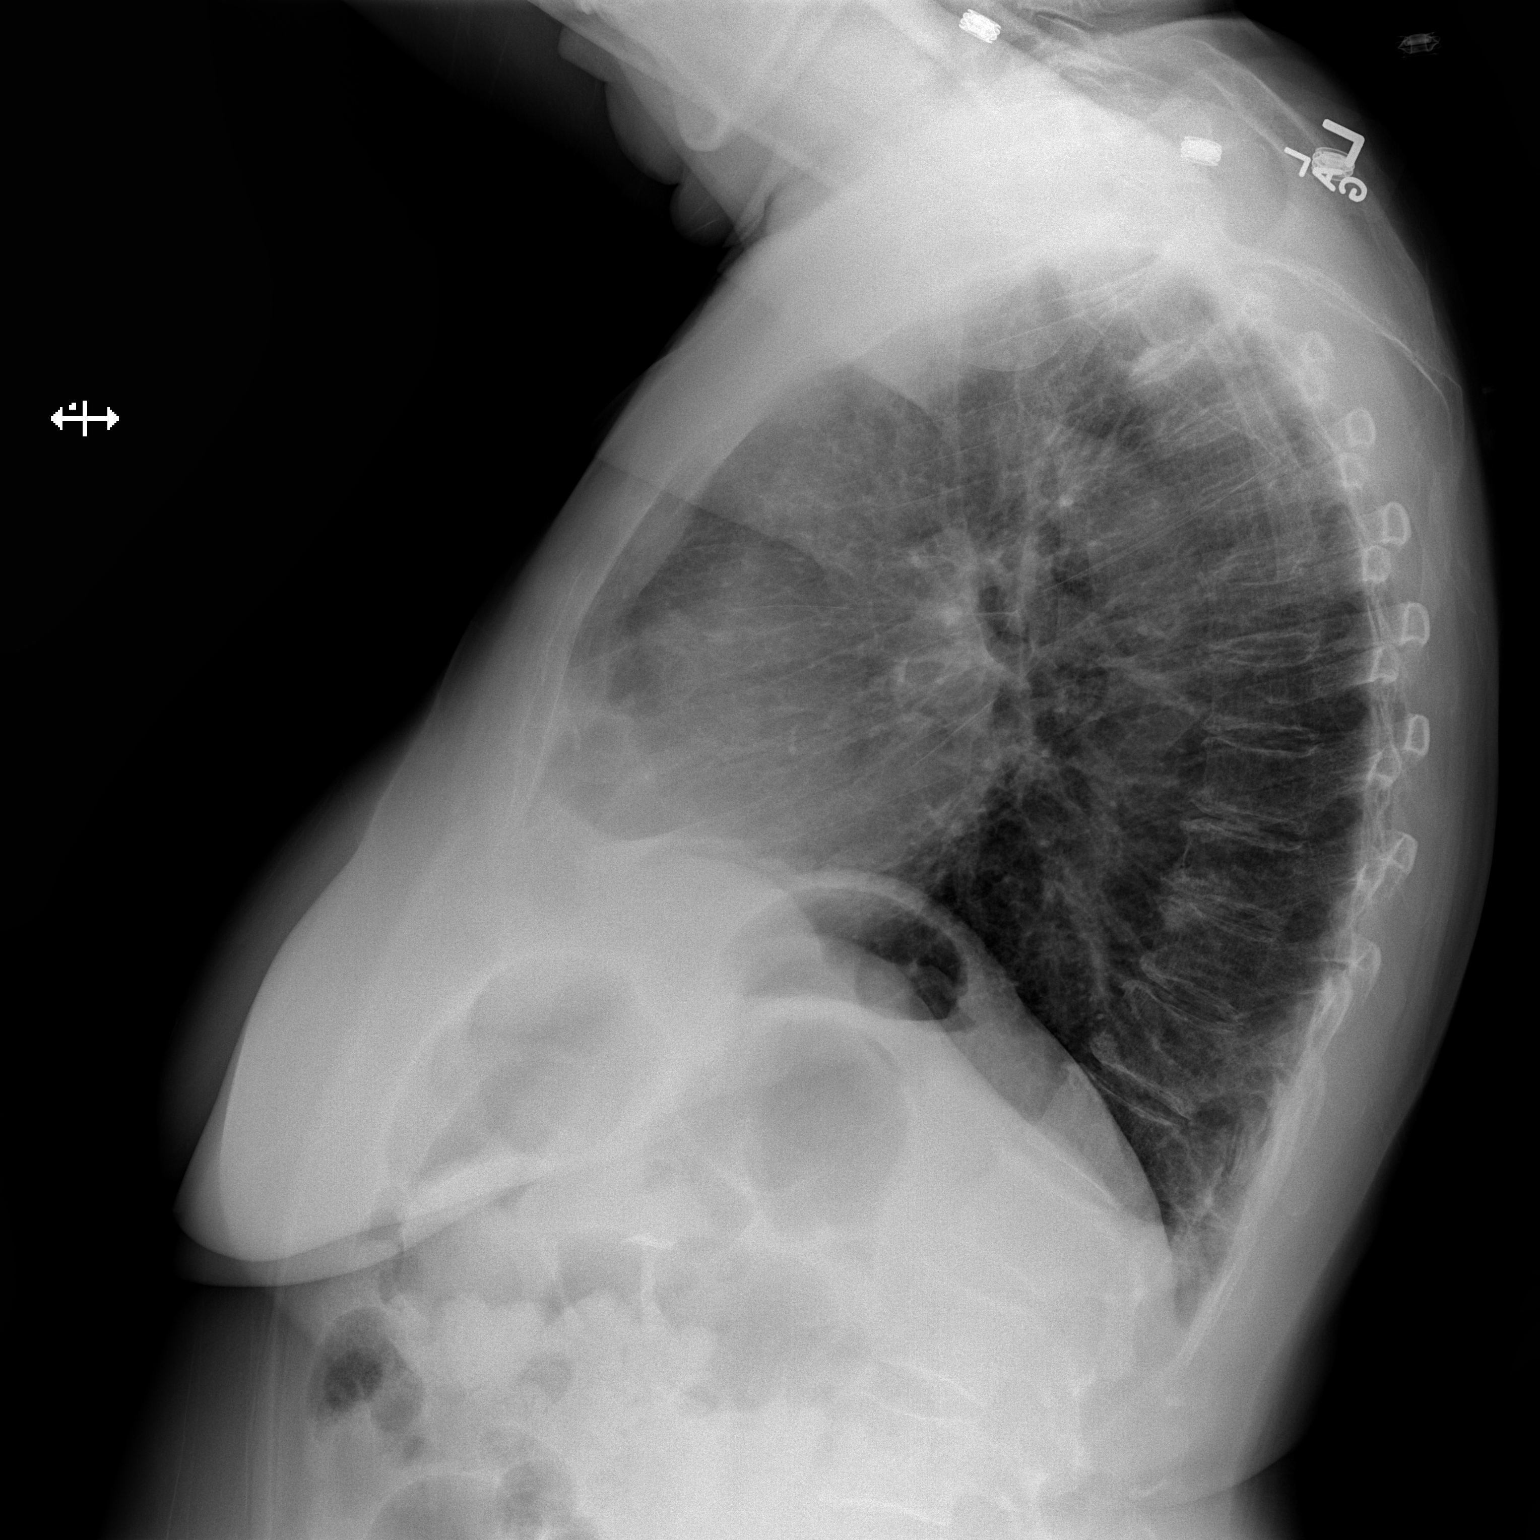

[2 of 2 positions shown; findings below may reference images not displayed]

FINDINGS: Cardiac and mediastinal contours appear normal.

The lungs appear clear.

No pleural effusion is identified.

Mild thoracic spondylosis noted.
IMPRESSION: 1.  Mild thoracic spondylosis.  No acute findings.

## 2013-07-28 ENCOUNTER — Other Ambulatory Visit: Payer: Self-pay | Admitting: Primary Care

## 2013-07-28 DIAGNOSIS — Z1231 Encounter for screening mammogram for malignant neoplasm of breast: Secondary | ICD-10-CM

## 2013-08-13 ENCOUNTER — Encounter (INDEPENDENT_AMBULATORY_CARE_PROVIDER_SITE_OTHER): Payer: Self-pay

## 2013-08-13 ENCOUNTER — Ambulatory Visit
Admission: RE | Admit: 2013-08-13 | Discharge: 2013-08-13 | Disposition: A | Discharge status: Home / Self Care | Payer: Medicare Other | Source: Ambulatory Visit | Attending: Primary Care | Admitting: Primary Care

## 2013-08-13 DIAGNOSIS — Z1231 Encounter for screening mammogram for malignant neoplasm of breast: Secondary | ICD-10-CM

## 2013-10-07 ENCOUNTER — Encounter: Payer: Self-pay | Admitting: Dietician

## 2013-10-07 ENCOUNTER — Encounter: Payer: Medicare Other | Attending: Internal Medicine | Admitting: Dietician

## 2013-10-07 VITALS — Ht 60.0 in | Wt 166.4 lb

## 2013-10-07 DIAGNOSIS — Z6834 Body mass index (BMI) 34.0-34.9, adult: Secondary | ICD-10-CM | POA: Insufficient documentation

## 2013-10-07 DIAGNOSIS — Z713 Dietary counseling and surveillance: Secondary | ICD-10-CM | POA: Insufficient documentation

## 2013-10-07 DIAGNOSIS — E669 Obesity, unspecified: Secondary | ICD-10-CM | POA: Diagnosis present

## 2013-10-07 NOTE — Progress Notes (Signed)
  Medical Nutrition Therapy:  Appt start time: 1030 end time:  1130.   Assessment:  Primary concerns today: Alexandra Cruz is here today with goals of improving her diet. She is retired and lives by herself in a retirement complex. She reports that she has lost about 16 lbs in the last year. She tends to fluctuate between 159 and 169 lbs. Over the last week she has maintained her weight at 166 lb. She has started walking more often and cutting back on "white foods" (pies, white bread, added sugar, pizza, etc) since she found out her HbA1C was slightly elevated (5.8%). She also watches her salt intake. She does her own food shopping and food preparation,  mostly baking and sauteing with canola or olive oil (has a fryer). She eats out 1-2 times a week usually at [employer] (15-20 minute walk from her house). She reports that she knows she needs to increase her vegetable intake.   Preferred Learning Style:   No preference indicated   Learning Readiness:   Ready   MEDICATIONS: synthroid, calcium, vitamin D    DIETARY INTAKE:  Usual eating pattern includes 2 meals and 1-2 snacks per day.  Avoided foods include grapefruit and grapefruit juice, white foods  24-hr recall:  B ( AM): oatmeal made with juice, dried fruit or banana, water or hot tea and 4-6 oz juice Snk ( AM): none  L ( PM): none Snk ( PM): blueberries or other fruit, egg and toast D ( PM): Fish or hamburger steak, turnip greens or a salad, squash medley; Omelet and toast (between 4pm -7pm) Snk ( PM): banana (does not eat past 9:30pm) Beverages: water, hot tea, juice   Usual physical activity: walks 2 times a day 15-20 minutes x 2, sometimes goes for an additional walk After eating nuts after meat  Estimated energy needs: 1600-1800 calories 180 g carbohydrates 120 g protein 44 g fat  Progress Towards Goal(s):  In progress.    Nutritional Diagnosis:  [place]-3.3 Overweight/obesity As related to excessive energy intake .  As  evidenced by dietary recall and BMI of 34.    Intervention:  Nutrition education.  Continue daily walks, work up to 60 minutes day  Continue to choose whole wheat bread and grains Try eating snacks that combine protein and carb foods (fruit and cheese, nuts and banana, boiled egg and toast) Avoid skipping meals. If you feel hungry between breakfast and dinner eat a snack.  Keep the MyPlate in mind when preparing meals or eating out. Filling 1/2 your plate with vegetables, 1/4 plate of grains. 1/4 plate lean protein (baked chicken or fish) Use small plates and dishes to limit intake of dessert foods (ice cream) and nuts  Limit juice intake to 4-6 ounces a few times a week  Teaching Method Utilized: Visual Auditory Hands on  Handouts given during visit include:  MyPlate Handout   15 gm carbohydrate snacks  Barriers to learning/adherence to lifestyle change: knowledge deficit  Demonstrated degree of understanding via:  Teach Back   Monitoring/Evaluation:  Dietary intake, exercise, Hemoglobin A1C and body weight prn.

## 2013-10-07 NOTE — Patient Instructions (Addendum)
Continue daily walks, work up to 60 minutes day  Continue to choose whole wheat bread and grains Try eating snacks that combine protein and carb foods (fruit and cheese, nuts and banana, boiled egg and toast) Avoid skipping meals. If you feel hungry between breakfast and dinner eat a snack.  Keep the MyPlate in mind when preparing meals or eating out. Filling 1/2 your plate with vegetables, 1/4 plate of grains. 1/4 plate lean protein (baked chicken or fish) Use small plates and dishes to limit intake of dessert foods (ice cream) and nuts  Limit juice intake to 4-6 ounces a few times a wee

## 2014-05-17 ENCOUNTER — Encounter (INDEPENDENT_AMBULATORY_CARE_PROVIDER_SITE_OTHER): Payer: Medicare Other | Admitting: Ophthalmology

## 2014-05-17 DIAGNOSIS — H35033 Hypertensive retinopathy, bilateral: Secondary | ICD-10-CM

## 2014-05-17 DIAGNOSIS — H3531 Nonexudative age-related macular degeneration: Secondary | ICD-10-CM

## 2014-05-17 DIAGNOSIS — I10 Essential (primary) hypertension: Secondary | ICD-10-CM

## 2014-05-17 DIAGNOSIS — H43813 Vitreous degeneration, bilateral: Secondary | ICD-10-CM

## 2014-05-17 DIAGNOSIS — H2513 Age-related nuclear cataract, bilateral: Secondary | ICD-10-CM

## 2014-05-17 DIAGNOSIS — D3131 Benign neoplasm of right choroid: Secondary | ICD-10-CM

## 2014-06-14 ENCOUNTER — Encounter (INDEPENDENT_AMBULATORY_CARE_PROVIDER_SITE_OTHER): Payer: Medicare Other | Admitting: Ophthalmology

## 2014-06-16 ENCOUNTER — Encounter (INDEPENDENT_AMBULATORY_CARE_PROVIDER_SITE_OTHER): Payer: Medicare Other | Admitting: Ophthalmology

## 2014-06-16 DIAGNOSIS — D3132 Benign neoplasm of left choroid: Secondary | ICD-10-CM

## 2014-06-16 DIAGNOSIS — H3531 Nonexudative age-related macular degeneration: Secondary | ICD-10-CM

## 2014-06-16 DIAGNOSIS — H43813 Vitreous degeneration, bilateral: Secondary | ICD-10-CM

## 2014-06-16 DIAGNOSIS — I10 Essential (primary) hypertension: Secondary | ICD-10-CM | POA: Diagnosis not present

## 2014-06-16 DIAGNOSIS — H35033 Hypertensive retinopathy, bilateral: Secondary | ICD-10-CM

## 2014-07-12 ENCOUNTER — Emergency Department (HOSPITAL_COMMUNITY)
Admission: EM | Admit: 2014-07-12 | Discharge: 2014-07-12 | Disposition: A | Discharge status: Home / Self Care | Payer: Medicare Other | Attending: Emergency Medicine | Admitting: Emergency Medicine

## 2014-07-12 ENCOUNTER — Encounter (HOSPITAL_COMMUNITY): Payer: Self-pay | Admitting: Emergency Medicine

## 2014-07-12 DIAGNOSIS — Z299 Encounter for prophylactic measures, unspecified: Secondary | ICD-10-CM

## 2014-07-12 DIAGNOSIS — Y9289 Other specified places as the place of occurrence of the external cause: Secondary | ICD-10-CM | POA: Diagnosis not present

## 2014-07-12 DIAGNOSIS — Y998 Other external cause status: Secondary | ICD-10-CM | POA: Diagnosis not present

## 2014-07-12 DIAGNOSIS — Z8739 Personal history of other diseases of the musculoskeletal system and connective tissue: Secondary | ICD-10-CM | POA: Insufficient documentation

## 2014-07-12 DIAGNOSIS — S8992XA Unspecified injury of left lower leg, initial encounter: Secondary | ICD-10-CM | POA: Diagnosis not present

## 2014-07-12 DIAGNOSIS — Z79899 Other long term (current) drug therapy: Secondary | ICD-10-CM | POA: Diagnosis not present

## 2014-07-12 DIAGNOSIS — W228XXA Striking against or struck by other objects, initial encounter: Secondary | ICD-10-CM | POA: Diagnosis not present

## 2014-07-12 DIAGNOSIS — I10 Essential (primary) hypertension: Secondary | ICD-10-CM | POA: Diagnosis not present

## 2014-07-12 DIAGNOSIS — Y9389 Activity, other specified: Secondary | ICD-10-CM | POA: Insufficient documentation

## 2014-07-12 DIAGNOSIS — Z8669 Personal history of other diseases of the nervous system and sense organs: Secondary | ICD-10-CM | POA: Diagnosis not present

## 2014-07-12 DIAGNOSIS — E079 Disorder of thyroid, unspecified: Secondary | ICD-10-CM | POA: Diagnosis not present

## 2014-07-12 MED ORDER — ENOXAPARIN SODIUM 80 MG/0.8ML ~~LOC~~ SOLN
75.0000 mg | SUBCUTANEOUS | Status: AC
Start: 2014-07-12 — End: 2014-07-12
  Administered 2014-07-12: 75 mg via SUBCUTANEOUS
  Filled 2014-07-12: qty 1

## 2014-07-12 NOTE — ED Provider Notes (Signed)
CSN: [id number]     Arrival date & time 07/12/14  1815 History   First MD Initiated Contact with Patient 07/12/14 1950     Chief Complaint  Patient presents with  . Leg Pain   Patient is a 69 y.o. female presenting with leg pain. The history is provided by the patient. No language interpreter was used.  Leg Pain Location:  Leg Time since incident:  2 weeks Injury: yes   Leg location:  L leg Pain details:    Quality:  Dull   Radiates to:  Does not radiate   Severity:  Mild   Onset quality:  Gradual   Duration:  2 weeks   Timing:  Constant   Progression:  Worsening Chronicity:  New Dislocation: no   Foreign body present:  No foreign bodies Prior injury to area:  No Relieved by:  Nothing Exacerbated by: palpation. Ineffective treatments:  None tried Associated symptoms: swelling   Associated symptoms: no decreased ROM, no fever, no muscle weakness and no tingling    This chart was scribed for nurse practitioner Junius Creamer, NP working with Tanna Furry, MD, by Thea Alken, ED Scribe. This patient was seen in room WTR7/WTR7 and the patient's care was started at 7:53 PM.  Alexandra Cruz is a 69 y.o. female who presents to the Emergency Department complaining of left leg pain that began 2 weeks. Pt states she hit her left lower leg on a bed frame 2 weeks ago. She reports initially a white spot appeared to left lower leg but began to have redness, swelling and soreness with touch to ankle 1 week ago.  Past Medical History  Diagnosis Date  . Thyroid disease   . Hypertension   . Osteopenia   . Glaucoma    Past Surgical History  Procedure Laterality Date  . Abdominal hysterectomy  1979  . Vaginal prolapse repair  1989  . Hernia repair  2003, 2007  . Cholecystectomy  1998  . Bladder suspension      x2   Family History  Problem Relation Age of Onset  . Hypertension Mother   . Diabetes Other   . Hypertension Other   . Obesity Other    History  Substance Use Topics  .  Smoking status: Never Smoker   . Smokeless tobacco: Never Used  . Alcohol Use: No   OB History    No data available     Review of Systems  Constitutional: Negative for fever.  Respiratory: Negative for shortness of breath.   Cardiovascular: Positive for leg swelling. Negative for chest pain.  Musculoskeletal: Negative for myalgias and joint swelling.  Skin: Positive for color change. Negative for wound.  All other systems reviewed and are negative.  Allergies  Ace inhibitors and Bactrim  Home Medications   Prior to Admission medications   Medication Sig Start Date End Date Taking? Authorizing Provider  amLODipine-benazepril (LOTREL) 10-20 MG per capsule Take 1 capsule by mouth daily.    Historical Provider, MD  atenolol (TENORMIN) 25 MG tablet Take 25 mg by mouth daily.    Historical Provider, MD  Calcium Carbonate-Vitamin D (CALCIUM + D PO) Take 2 tablets by mouth 2 (two) times daily.    Historical Provider, MD  hydrochlorothiazide (HYDRODIURIL) 25 MG tablet Take 37.5 mg by mouth daily.    Historical Provider, MD  levothyroxine (SYNTHROID, LEVOTHROID) 50 MCG tablet Take 50 mcg by mouth daily.    Historical Provider, MD   BP 183/92 mmHg  Pulse 82  Temp(Src) 98.2 F (36.8 C) (Oral)  Resp 16  SpO2 99% Physical Exam  Constitutional: She is oriented to person, place, and time. She appears well-developed and well-nourished. No distress.  HENT:  Head: Normocephalic and atraumatic.  Eyes: Conjunctivae and EOM are normal.  Neck: Neck supple.  Cardiovascular: Normal rate.   Pulmonary/Chest: Effort normal.  Musculoskeletal: Normal range of motion. She exhibits edema and tenderness.       Legs: Left lower leg with an area of erythema approximately 5 cm round with a central firm nodule   Neurological: She is alert and oriented to person, place, and time.  Skin: Skin is warm and dry.  Psychiatric: She has a normal mood and affect. Her behavior is normal.  Nursing note and vitals  reviewed.   ED Course  Procedures (including critical care time) DIAGNOSTIC STUDIES: Oxygen Saturation is 99% on RA, normal by my interpretation.    COORDINATION OF CARE: 8:08 PM- Pt advised of plan for treatment and pt agrees.  Labs Review Labs Reviewed - No data to display  Imaging Review No results found.   EKG Interpretation None     Adult for outpatient vascular study in the morning.  She understands that if this is negative, she can follow-up with her primary care physician if it is positive, she will be sent back to the emergency room for continued treatment MDM   Final diagnoses:  None   I personally performed the services described in this documentation, which was scribed in my presence. The recorded information has been reviewed and is accurate.    Junius Creamer, NP 07/12/14 2017  Junius Creamer, NP 07/12/14 8416  Tanna Furry, MD 07/17/14 [id number]

## 2014-07-12 NOTE — ED Notes (Signed)
Pt c/o lt leg pain after hitting it on a bedframe about 2 wks ago.

## 2014-07-13 ENCOUNTER — Ambulatory Visit (HOSPITAL_COMMUNITY)
Admission: RE | Admit: 2014-07-13 | Discharge: 2014-07-13 | Disposition: A | Discharge status: Home / Self Care | Payer: Medicare Other | Source: Ambulatory Visit | Attending: Emergency Medicine | Admitting: Emergency Medicine

## 2014-07-13 DIAGNOSIS — M7989 Other specified soft tissue disorders: Secondary | ICD-10-CM | POA: Diagnosis not present

## 2014-07-13 NOTE — Progress Notes (Signed)
*  PRELIMINARY RESULTS* Vascular Ultrasound Left lower extremity venous duplex has been completed.  Preliminary findings: No evidence of DVT or superficial thrombosis.   Landry Mellow, RDMS, RVT  07/13/2014, 9:00 AM

## 2014-12-20 ENCOUNTER — Ambulatory Visit (INDEPENDENT_AMBULATORY_CARE_PROVIDER_SITE_OTHER): Payer: Medicare Other | Admitting: Ophthalmology

## 2014-12-20 DIAGNOSIS — H43813 Vitreous degeneration, bilateral: Secondary | ICD-10-CM | POA: Diagnosis not present

## 2014-12-20 DIAGNOSIS — H3531 Nonexudative age-related macular degeneration: Secondary | ICD-10-CM

## 2014-12-20 DIAGNOSIS — H35033 Hypertensive retinopathy, bilateral: Secondary | ICD-10-CM

## 2014-12-20 DIAGNOSIS — I10 Essential (primary) hypertension: Secondary | ICD-10-CM | POA: Diagnosis not present

## 2014-12-20 DIAGNOSIS — D3132 Benign neoplasm of left choroid: Secondary | ICD-10-CM

## 2014-12-20 DIAGNOSIS — H2513 Age-related nuclear cataract, bilateral: Secondary | ICD-10-CM | POA: Diagnosis not present

## 2015-09-19 ENCOUNTER — Other Ambulatory Visit: Payer: Self-pay | Admitting: Primary Care

## 2015-09-19 DIAGNOSIS — Z1231 Encounter for screening mammogram for malignant neoplasm of breast: Secondary | ICD-10-CM

## 2015-09-27 ENCOUNTER — Ambulatory Visit
Admission: RE | Admit: 2015-09-27 | Discharge: 2015-09-27 | Disposition: A | Discharge status: Home / Self Care | Payer: Medicare Other | Source: Ambulatory Visit | Attending: Primary Care | Admitting: Primary Care

## 2015-09-27 DIAGNOSIS — Z1231 Encounter for screening mammogram for malignant neoplasm of breast: Secondary | ICD-10-CM

## 2015-12-22 ENCOUNTER — Ambulatory Visit (INDEPENDENT_AMBULATORY_CARE_PROVIDER_SITE_OTHER): Payer: Medicare Other | Admitting: Ophthalmology

## 2015-12-22 DIAGNOSIS — H35033 Hypertensive retinopathy, bilateral: Secondary | ICD-10-CM | POA: Diagnosis not present

## 2015-12-22 DIAGNOSIS — D3132 Benign neoplasm of left choroid: Secondary | ICD-10-CM

## 2015-12-22 DIAGNOSIS — I10 Essential (primary) hypertension: Secondary | ICD-10-CM

## 2015-12-22 DIAGNOSIS — H43813 Vitreous degeneration, bilateral: Secondary | ICD-10-CM

## 2015-12-22 DIAGNOSIS — H353132 Nonexudative age-related macular degeneration, bilateral, intermediate dry stage: Secondary | ICD-10-CM | POA: Diagnosis not present

## 2016-08-29 ENCOUNTER — Other Ambulatory Visit: Payer: Self-pay | Admitting: Primary Care

## 2016-08-29 DIAGNOSIS — Z1231 Encounter for screening mammogram for malignant neoplasm of breast: Secondary | ICD-10-CM

## 2016-09-27 ENCOUNTER — Ambulatory Visit: Payer: Medicare Other

## 2016-10-24 ENCOUNTER — Ambulatory Visit
Admission: RE | Admit: 2016-10-24 | Discharge: 2016-10-24 | Disposition: A | Discharge status: Home / Self Care | Payer: Medicare Other | Source: Ambulatory Visit | Attending: Primary Care | Admitting: Primary Care

## 2016-10-24 DIAGNOSIS — Z1231 Encounter for screening mammogram for malignant neoplasm of breast: Secondary | ICD-10-CM

## 2016-12-31 ENCOUNTER — Ambulatory Visit (INDEPENDENT_AMBULATORY_CARE_PROVIDER_SITE_OTHER): Payer: Medicare Other | Admitting: Ophthalmology

## 2016-12-31 DIAGNOSIS — H43813 Vitreous degeneration, bilateral: Secondary | ICD-10-CM

## 2016-12-31 DIAGNOSIS — H35033 Hypertensive retinopathy, bilateral: Secondary | ICD-10-CM | POA: Diagnosis not present

## 2016-12-31 DIAGNOSIS — I10 Essential (primary) hypertension: Secondary | ICD-10-CM

## 2016-12-31 DIAGNOSIS — H2513 Age-related nuclear cataract, bilateral: Secondary | ICD-10-CM

## 2016-12-31 DIAGNOSIS — D3132 Benign neoplasm of left choroid: Secondary | ICD-10-CM | POA: Diagnosis not present

## 2016-12-31 DIAGNOSIS — H353132 Nonexudative age-related macular degeneration, bilateral, intermediate dry stage: Secondary | ICD-10-CM | POA: Diagnosis not present

## 2017-09-18 ENCOUNTER — Other Ambulatory Visit: Payer: Self-pay | Admitting: Family Medicine

## 2017-09-18 DIAGNOSIS — Z1231 Encounter for screening mammogram for malignant neoplasm of breast: Secondary | ICD-10-CM

## 2017-10-25 ENCOUNTER — Ambulatory Visit
Admission: RE | Admit: 2017-10-25 | Discharge: 2017-10-25 | Disposition: A | Discharge status: Home / Self Care | Payer: Medicare Other | Source: Ambulatory Visit | Attending: Family Medicine | Admitting: Family Medicine

## 2017-10-25 DIAGNOSIS — Z1231 Encounter for screening mammogram for malignant neoplasm of breast: Secondary | ICD-10-CM

## 2017-12-26 ENCOUNTER — Encounter (HOSPITAL_COMMUNITY): Payer: Self-pay | Admitting: Emergency Medicine

## 2017-12-26 ENCOUNTER — Ambulatory Visit (HOSPITAL_COMMUNITY)
Admission: EM | Admit: 2017-12-26 | Discharge: 2017-12-26 | Disposition: A | Discharge status: Home / Self Care | Payer: Medicare Other | Attending: Family Medicine | Admitting: Family Medicine

## 2017-12-26 DIAGNOSIS — Z882 Allergy status to sulfonamides status: Secondary | ICD-10-CM | POA: Diagnosis not present

## 2017-12-26 DIAGNOSIS — Z888 Allergy status to other drugs, medicaments and biological substances status: Secondary | ICD-10-CM | POA: Diagnosis not present

## 2017-12-26 DIAGNOSIS — N760 Acute vaginitis: Secondary | ICD-10-CM | POA: Diagnosis not present

## 2017-12-26 DIAGNOSIS — K219 Gastro-esophageal reflux disease without esophagitis: Secondary | ICD-10-CM | POA: Insufficient documentation

## 2017-12-26 DIAGNOSIS — I10 Essential (primary) hypertension: Secondary | ICD-10-CM | POA: Insufficient documentation

## 2017-12-26 DIAGNOSIS — Z79899 Other long term (current) drug therapy: Secondary | ICD-10-CM | POA: Insufficient documentation

## 2017-12-26 DIAGNOSIS — Z8249 Family history of ischemic heart disease and other diseases of the circulatory system: Secondary | ICD-10-CM | POA: Insufficient documentation

## 2017-12-26 DIAGNOSIS — L298 Other pruritus: Secondary | ICD-10-CM | POA: Diagnosis present

## 2017-12-26 DIAGNOSIS — E079 Disorder of thyroid, unspecified: Secondary | ICD-10-CM | POA: Diagnosis not present

## 2017-12-26 DIAGNOSIS — Z9049 Acquired absence of other specified parts of digestive tract: Secondary | ICD-10-CM | POA: Diagnosis not present

## 2017-12-26 DIAGNOSIS — B372 Candidiasis of skin and nail: Secondary | ICD-10-CM

## 2017-12-26 DIAGNOSIS — M8580 Other specified disorders of bone density and structure, unspecified site: Secondary | ICD-10-CM | POA: Diagnosis not present

## 2017-12-26 DIAGNOSIS — H409 Unspecified glaucoma: Secondary | ICD-10-CM | POA: Insufficient documentation

## 2017-12-26 DIAGNOSIS — Z833 Family history of diabetes mellitus: Secondary | ICD-10-CM | POA: Insufficient documentation

## 2017-12-26 DIAGNOSIS — Z79890 Hormone replacement therapy (postmenopausal): Secondary | ICD-10-CM | POA: Diagnosis not present

## 2017-12-26 DIAGNOSIS — K210 Gastro-esophageal reflux disease with esophagitis: Secondary | ICD-10-CM

## 2017-12-26 DIAGNOSIS — Z90710 Acquired absence of both cervix and uterus: Secondary | ICD-10-CM | POA: Insufficient documentation

## 2017-12-26 LAB — POCT URINALYSIS DIP (DEVICE)
Bilirubin Urine: NEGATIVE
Glucose, UA: NEGATIVE mg/dL
Hgb urine dipstick: NEGATIVE
Ketones, ur: NEGATIVE mg/dL
Nitrite: NEGATIVE
Protein, ur: NEGATIVE mg/dL
Specific Gravity, Urine: 1.02 (ref 1.005–1.030)
Urobilinogen, UA: 0.2 mg/dL (ref 0.0–1.0)
pH: 6 (ref 5.0–8.0)

## 2017-12-26 MED ORDER — OMEPRAZOLE 20 MG PO CPDR: 20.0000 mg | DELAYED_RELEASE_CAPSULE | Freq: Every day | ORAL | 0 refills | Status: DC

## 2017-12-26 MED ORDER — FLUCONAZOLE 150 MG PO TABS: ORAL_TABLET | ORAL | 0 refills | Status: DC

## 2017-12-26 MED ORDER — CLOTRIMAZOLE 1 % VA CREA: TOPICAL_CREAM | VAGINAL | 0 refills | Status: DC

## 2017-12-26 NOTE — ED Triage Notes (Signed)
Pt here for vaginal irritation and itching; pt sts feels same as when had yeast infection in past; pt also requesting blood work

## 2017-12-26 NOTE — Discharge Instructions (Signed)
Use of topical yeast cream once daily to rash. Keep clothing clean and dry, change out of any moist clothing promptly. Take one pill today, may repeat in 3 days if still with symptoms.  Please continue to follow with your gynecologist for recheck of your bladder prolapse and continued discussion about treatment options for this.  Will notify you of any positive findings from your vaginal swab and if any changes to treatment are needed.   Omeprazole daily to help with heartburn, see diet recommendations as well. Please continue to follow with your primary care provider for recheck of this.

## 2017-12-26 NOTE — ED Provider Notes (Signed)
[facility] [facility]    CSN: [id number] Arrival date & time: 12/26/17  9735     History   Chief Complaint Chief Complaint  Patient presents with  . Vaginal Itching    HPI Alexandra Cruz is a 72 y.o. female.   [name] presents with complaints of itching and burning to vulva and vagina which started approximately 2 weeks ago. States feels similar to yeast infection she had approximately 8 months ago. Has been applying topical cream to external rash which does help some. States she has a bladder prolapse, has had repair years ago in the past. Has seen gyne for this with no immediate plans for repair at this time. No change to any urinary symptoms, states she has to manipulate her bladder by laying and rolling side to side prior to voiding to ensure she empties her bladder. No frequency. No fevers. Also states she gets intermittent heart burn with eating. Has taken omeprazole in the past but states she doesn't want to be on a medication long term therefore stopped taking it. Has been treated for hpylori in the past. No current chest pain  Or burning. No nausea or shortness of breath. Hx of htn, thyroid disease.     ROS per HPI.      Past Medical History:  Diagnosis Date  . Glaucoma   . Hypertension   . Osteopenia   . Thyroid disease     Patient Active Problem List   Diagnosis Date Noted  . Hyponatremia 10/31/2011  . Hypokalemia 10/31/2011  . Osteopenia     Past Surgical History:  Procedure Laterality Date  . ABDOMINAL HYSTERECTOMY  1979  . BLADDER SUSPENSION     x2  . CHOLECYSTECTOMY  1998  . HERNIA REPAIR  2003, 2007  . VAGINAL PROLAPSE REPAIR  1989    OB History   None      Home Medications    Prior to Admission medications   Medication Sig Start Date End Date Taking? Authorizing Provider  amLODipine-benazepril (LOTREL) 10-20 MG per capsule Take 1 capsule by mouth daily.    [provider]  atenolol (TENORMIN) 25 MG tablet Take 25 mg by  mouth daily.    [provider]  Calcium Carbonate-Vitamin D (CALCIUM + D PO) Take 2 tablets by mouth 2 (two) times daily.    [provider]  clotrimazole (GYNE-LOTRIMIN) 1 % vaginal cream Apply topically once a day to vulvar rash 12/26/17   Augusto Gamble B, NP  fluconazole (DIFLUCAN) 150 MG tablet Take 1 tablet today. If still with symptoms may repeat in 3 days. 12/26/17   Zigmund Gottron, NP  hydrochlorothiazide (HYDRODIURIL) 25 MG tablet Take 37.5 mg by mouth daily.    [provider]  levothyroxine (SYNTHROID, LEVOTHROID) 50 MCG tablet Take 50 mcg by mouth daily.    [provider]  omeprazole (PRILOSEC) 20 MG capsule Take 1 capsule (20 mg total) by mouth daily. 12/26/17   Zigmund Gottron, NP    Family History Family History  Problem Relation Age of Onset  . Hypertension Mother   . Diabetes Other   . Hypertension Other   . Obesity Other     Social History Social History   Tobacco Use  . Smoking status: Never Smoker  . Smokeless tobacco: Never Used  Substance Use Topics  . Alcohol use: No  . Drug use: No     Allergies   Ace inhibitors and Bactrim [sulfamethoxazole-trimethoprim]   Review of Systems Review of  Systems   Physical Exam Triage Vital Signs ED Triage Vitals [12/26/17 0957]  Enc Vitals Group     BP (!) 147/110     Pulse Rate 88     Resp 18     Temp 98.2 F (36.8 C)     Temp Source Oral     SpO2 99 %     Weight      Height      Head Circumference      Peak Flow      Pain Score      Pain Loc      Pain Edu?      Excl. in [place]?    No data found.  Updated Vital Signs BP (!) 147/110 (BP Location: Left Arm)   Pulse 88   Temp 98.2 F (36.8 C) (Oral)   Resp 18   SpO2 99%    Physical Exam  Constitutional: She is oriented to person, place, and time. She appears well-developed and well-nourished. No distress.  Cardiovascular: Normal rate, regular rhythm and normal heart sounds.  Pulmonary/Chest: Effort normal  and breath sounds normal.  Genitourinary:  Genitourinary Comments: Bladder prolapse visible without use of speculum; soft, pink; non tender; easily reduces; vaginal cytology collected, patient tolerated well; external redness noted bilaterally which extends up to panus as well; no discharge or visible internal rash or redness   Neurological: She is alert and oriented to person, place, and time.  Skin: Skin is warm and dry.     UC Treatments / Results  Labs (all labs ordered are listed, but only abnormal results are displayed) Labs Reviewed  POCT URINALYSIS DIP (DEVICE) - Abnormal; Notable for the following components:      Result Value   Leukocytes, UA SMALL (*)    All other components within normal limits  URINE CULTURE  CERVICOVAGINAL ANCILLARY ONLY    EKG None  Radiology No results found.  Procedures Procedures (including critical care time)  Medications Ordered in UC Medications - No data to display  Initial Impression / Assessment and Plan / UC Course  I have reviewed the triage vital signs and the nursing notes.  Pertinent labs & imaging results that were available during my care of the patient were reviewed by me and considered in my medical decision making (see chart for details).     External candidal infection noted, refilled topical cream. Diflucan provided. Per patient no change to bladder prolapse without any red flag findings with this today. To continue to follow with gynecology. Restart omeprazole. If symptoms worsen or do not improve in the next week to return to be seen or to follow up with PCP.  Patient verbalized understanding and agreeable to plan.    Final Clinical Impressions(s) / UC Diagnoses   Final diagnoses:  Candidal dermatitis  Vaginitis and vulvovaginitis  Gastroesophageal reflux disease, esophagitis presence not specified     Discharge Instructions     Use of topical yeast cream once daily to rash. Keep clothing clean and dry, change  out of any moist clothing promptly. Take one pill today, may repeat in 3 days if still with symptoms.  Please continue to follow with your gynecologist for recheck of your bladder prolapse and continued discussion about treatment options for this.  Will notify you of any positive findings from your vaginal swab and if any changes to treatment are needed.   Omeprazole daily to help with heartburn, see diet recommendations as well. Please continue to follow with your primary  care provider for recheck of this.    ED Prescriptions    Medication Sig Dispense Auth. Provider   fluconazole (DIFLUCAN) 150 MG tablet Take 1 tablet today. If still with symptoms may repeat in 3 days. 2 tablet Augusto Gamble B, NP   clotrimazole (GYNE-LOTRIMIN) 1 % vaginal cream Apply topically once a day to vulvar rash 45 g Augusto Gamble B, NP   omeprazole (PRILOSEC) 20 MG capsule Take 1 capsule (20 mg total) by mouth daily. 30 capsule Zigmund Gottron, NP     Controlled Substance Prescriptions [place] Controlled Substance Registry consulted? Not Applicable   Zigmund Gottron, NP 12/26/17 1053

## 2017-12-27 LAB — URINE CULTURE: Culture: NO GROWTH

## 2017-12-27 LAB — CERVICOVAGINAL ANCILLARY ONLY
Bacterial vaginitis: NEGATIVE
Candida vaginitis: NEGATIVE

## 2018-01-06 ENCOUNTER — Encounter (INDEPENDENT_AMBULATORY_CARE_PROVIDER_SITE_OTHER): Payer: Medicare Other | Admitting: Ophthalmology

## 2018-01-06 DIAGNOSIS — H353132 Nonexudative age-related macular degeneration, bilateral, intermediate dry stage: Secondary | ICD-10-CM

## 2018-01-06 DIAGNOSIS — I10 Essential (primary) hypertension: Secondary | ICD-10-CM | POA: Diagnosis not present

## 2018-01-06 DIAGNOSIS — H35033 Hypertensive retinopathy, bilateral: Secondary | ICD-10-CM

## 2018-01-06 DIAGNOSIS — H43813 Vitreous degeneration, bilateral: Secondary | ICD-10-CM

## 2018-01-06 DIAGNOSIS — H2513 Age-related nuclear cataract, bilateral: Secondary | ICD-10-CM

## 2018-01-06 DIAGNOSIS — D3132 Benign neoplasm of left choroid: Secondary | ICD-10-CM

## 2018-09-15 ENCOUNTER — Other Ambulatory Visit: Payer: Self-pay | Admitting: Family Medicine

## 2018-09-15 DIAGNOSIS — Z1231 Encounter for screening mammogram for malignant neoplasm of breast: Secondary | ICD-10-CM

## 2018-11-03 ENCOUNTER — Ambulatory Visit: Payer: Medicare Other

## 2018-12-23 ENCOUNTER — Ambulatory Visit
Admission: RE | Admit: 2018-12-23 | Discharge: 2018-12-23 | Disposition: A | Discharge status: Home / Self Care | Payer: Medicare Other | Source: Ambulatory Visit | Attending: Family Medicine | Admitting: Family Medicine

## 2018-12-23 ENCOUNTER — Other Ambulatory Visit: Payer: Self-pay

## 2018-12-23 DIAGNOSIS — Z1231 Encounter for screening mammogram for malignant neoplasm of breast: Secondary | ICD-10-CM

## 2019-01-07 ENCOUNTER — Other Ambulatory Visit: Payer: Self-pay

## 2019-01-07 ENCOUNTER — Encounter (INDEPENDENT_AMBULATORY_CARE_PROVIDER_SITE_OTHER): Payer: Medicare Other | Admitting: Ophthalmology

## 2019-01-07 DIAGNOSIS — H43813 Vitreous degeneration, bilateral: Secondary | ICD-10-CM

## 2019-01-07 DIAGNOSIS — I10 Essential (primary) hypertension: Secondary | ICD-10-CM

## 2019-01-07 DIAGNOSIS — H35033 Hypertensive retinopathy, bilateral: Secondary | ICD-10-CM | POA: Diagnosis not present

## 2019-01-07 DIAGNOSIS — D3132 Benign neoplasm of left choroid: Secondary | ICD-10-CM | POA: Diagnosis not present

## 2019-01-07 DIAGNOSIS — H353132 Nonexudative age-related macular degeneration, bilateral, intermediate dry stage: Secondary | ICD-10-CM

## 2019-05-28 ENCOUNTER — Other Ambulatory Visit: Payer: Self-pay | Admitting: Family Medicine

## 2019-05-28 DIAGNOSIS — E2839 Other primary ovarian failure: Secondary | ICD-10-CM

## 2019-05-28 DIAGNOSIS — Z1231 Encounter for screening mammogram for malignant neoplasm of breast: Secondary | ICD-10-CM

## 2019-07-15 ENCOUNTER — Encounter (INDEPENDENT_AMBULATORY_CARE_PROVIDER_SITE_OTHER): Payer: Medicare Other | Admitting: Ophthalmology

## 2019-07-15 ENCOUNTER — Other Ambulatory Visit: Payer: Self-pay

## 2019-07-15 DIAGNOSIS — H35033 Hypertensive retinopathy, bilateral: Secondary | ICD-10-CM | POA: Diagnosis not present

## 2019-07-15 DIAGNOSIS — H353132 Nonexudative age-related macular degeneration, bilateral, intermediate dry stage: Secondary | ICD-10-CM

## 2019-07-15 DIAGNOSIS — I10 Essential (primary) hypertension: Secondary | ICD-10-CM | POA: Diagnosis not present

## 2019-07-15 DIAGNOSIS — D3132 Benign neoplasm of left choroid: Secondary | ICD-10-CM | POA: Diagnosis not present

## 2019-07-15 DIAGNOSIS — H43813 Vitreous degeneration, bilateral: Secondary | ICD-10-CM

## 2020-01-19 ENCOUNTER — Encounter (INDEPENDENT_AMBULATORY_CARE_PROVIDER_SITE_OTHER): Payer: Medicare Other | Admitting: Ophthalmology

## 2020-01-19 ENCOUNTER — Other Ambulatory Visit: Payer: Self-pay

## 2020-01-19 DIAGNOSIS — I10 Essential (primary) hypertension: Secondary | ICD-10-CM | POA: Diagnosis not present

## 2020-01-19 DIAGNOSIS — H35033 Hypertensive retinopathy, bilateral: Secondary | ICD-10-CM | POA: Diagnosis not present

## 2020-01-19 DIAGNOSIS — D3132 Benign neoplasm of left choroid: Secondary | ICD-10-CM

## 2020-01-19 DIAGNOSIS — H353132 Nonexudative age-related macular degeneration, bilateral, intermediate dry stage: Secondary | ICD-10-CM

## 2020-01-19 DIAGNOSIS — H43813 Vitreous degeneration, bilateral: Secondary | ICD-10-CM

## 2020-01-27 ENCOUNTER — Ambulatory Visit
Admission: RE | Admit: 2020-01-27 | Discharge: 2020-01-27 | Disposition: A | Discharge status: Home / Self Care | Payer: Medicare Other | Source: Ambulatory Visit | Attending: Family Medicine | Admitting: Family Medicine

## 2020-01-27 ENCOUNTER — Other Ambulatory Visit: Payer: Self-pay

## 2020-01-27 DIAGNOSIS — E2839 Other primary ovarian failure: Secondary | ICD-10-CM

## 2020-01-27 DIAGNOSIS — Z1231 Encounter for screening mammogram for malignant neoplasm of breast: Secondary | ICD-10-CM

## 2020-03-02 LAB — BASIC METABOLIC PANEL
BUN: 27 — AB (ref 4–21)
CO2: 22 (ref 13–22)
Chloride: 106 (ref 99–108)
Creatinine: 1.2 — AB (ref 0.5–1.1)
Glucose: 102
Potassium: 5.4 — AB (ref 3.4–5.3)
Sodium: 141 (ref 137–147)

## 2020-03-02 LAB — HEPATIC FUNCTION PANEL
ALT: 8 (ref 7–35)
AST: 12 — AB (ref 13–35)
Alkaline Phosphatase: 104 (ref 25–125)
Bilirubin, Total: 0.3

## 2020-03-02 LAB — LIPID PANEL
Cholesterol: 158 (ref 0–200)
HDL: 81 — AB (ref 35–70)
LDL Cholesterol: 66
Triglycerides: 52 (ref 40–160)

## 2020-03-02 LAB — COMPREHENSIVE METABOLIC PANEL
Albumin: 4.3 (ref 3.5–5.0)
Calcium: 9.6 (ref 8.7–10.7)
GFR calc Af Amer: 53
GFR calc non Af Amer: 46
Globulin: 2.9

## 2020-03-02 LAB — CBC AND DIFFERENTIAL
HCT: 34 — AB (ref 36–46)
Hemoglobin: 11.4 — AB (ref 12.0–16.0)
Neutrophils Absolute: 4.8
Platelets: 373 (ref 150–399)
WBC: 7.2

## 2020-03-02 LAB — TSH: TSH: 3.32 (ref 0.41–5.90)

## 2020-03-02 LAB — CBC: RBC: 3.64 — AB (ref 3.87–5.11)

## 2020-06-03 ENCOUNTER — Other Ambulatory Visit: Payer: Self-pay

## 2020-06-06 ENCOUNTER — Encounter: Payer: Self-pay | Admitting: Internal Medicine

## 2020-06-06 ENCOUNTER — Other Ambulatory Visit: Payer: Self-pay

## 2020-06-06 ENCOUNTER — Ambulatory Visit (INDEPENDENT_AMBULATORY_CARE_PROVIDER_SITE_OTHER): Payer: Medicare Other | Admitting: Internal Medicine

## 2020-06-06 ENCOUNTER — Encounter (INDEPENDENT_AMBULATORY_CARE_PROVIDER_SITE_OTHER): Payer: Self-pay

## 2020-06-06 ENCOUNTER — Telehealth: Payer: Self-pay | Admitting: Internal Medicine

## 2020-06-06 VITALS — BP 138/76 | HR 86 | Temp 98.5°F | Resp 18 | Ht 60.0 in | Wt 152.8 lb

## 2020-06-06 DIAGNOSIS — I10 Essential (primary) hypertension: Secondary | ICD-10-CM | POA: Diagnosis not present

## 2020-06-06 DIAGNOSIS — E039 Hypothyroidism, unspecified: Secondary | ICD-10-CM

## 2020-06-06 DIAGNOSIS — M810 Age-related osteoporosis without current pathological fracture: Secondary | ICD-10-CM

## 2020-06-06 LAB — CBC
HCT: 36.8 % (ref 36.0–46.0)
Hemoglobin: 12.1 g/dL (ref 12.0–15.0)
MCHC: 32.9 g/dL (ref 30.0–36.0)
MCV: 94.5 fl (ref 78.0–100.0)
Platelets: 333 10*3/uL (ref 150.0–400.0)
RBC: 3.9 Mil/uL (ref 3.87–5.11)
RDW: 13.5 % (ref 11.5–15.5)
WBC: 6.9 10*3/uL (ref 4.0–10.5)

## 2020-06-06 LAB — COMPREHENSIVE METABOLIC PANEL
ALT: 9 U/L (ref 0–35)
AST: 12 U/L (ref 0–37)
Albumin: 3.9 g/dL (ref 3.5–5.2)
Alkaline Phosphatase: 83 U/L (ref 39–117)
BUN: 22 mg/dL (ref 6–23)
CO2: 25 mEq/L (ref 19–32)
Calcium: 9.5 mg/dL (ref 8.4–10.5)
Chloride: 105 mEq/L (ref 96–112)
Creatinine, Ser: 1.01 mg/dL (ref 0.40–1.20)
GFR: 55.05 mL/min — ABNORMAL LOW (ref >60.00)
Glucose, Bld: 93 mg/dL (ref 70–99)
Potassium: 4.5 mEq/L (ref 3.5–5.1)
Sodium: 139 mEq/L (ref 135–145)
Total Bilirubin: 0.4 mg/dL (ref 0.2–1.2)
Total Protein: 7.2 g/dL (ref 6.0–8.3)

## 2020-06-06 LAB — TSH: TSH: 2.37 u[IU]/mL (ref 0.35–4.50)

## 2020-06-06 LAB — LIPID PANEL
Cholesterol: 155 mg/dL (ref 0–200)
HDL: 83 mg/dL (ref >39.00)
LDL Cholesterol: 58 mg/dL (ref 0–99)
NonHDL: 71.63
Total CHOL/HDL Ratio: 2
Triglycerides: 66 mg/dL (ref 0.0–149.0)
VLDL: 13.2 mg/dL (ref 0.0–40.0)

## 2020-06-06 LAB — VITAMIN D 25 HYDROXY (VIT D DEFICIENCY, FRACTURES): VITD: 55.16 ng/mL (ref 30.00–100.00)

## 2020-06-06 LAB — MAGNESIUM: Magnesium: 2 mg/dL (ref 1.5–2.5)

## 2020-06-06 MED ORDER — AMLODIPINE BESYLATE 10 MG PO TABS: 10.0000 mg | ORAL_TABLET | Freq: Every day | ORAL | 3 refills | Status: DC

## 2020-06-06 NOTE — Progress Notes (Signed)
   Subjective:   Patient ID: Alexandra Cruz, female    DOB: March 21, 1946, 75 y.o.   MRN: [id number]  HPI The patient is a 75 YO female coming in for concerns about osteoporosis (just found this out with dexa scan, she brought a copy of this for [place], is not taking medication for this yet, she is concerned about side effects, does not think she can take bisphosphonates given her GERD, no current fracture, no family history of hip fracture), and blood pressure (taking losartan and amlodipine and spironolactone, denies side effects, due for labs, denies headaches or chest pains, does not check BP at home), and thyroid (taking synthroid 50 mcg daily, dosing stable but due for labs).   PMH, [facility], social history reviewed and updated  Review of Systems  Constitutional: Negative.   HENT: Negative.   Eyes: Negative.   Respiratory: Negative for cough, chest tightness and shortness of breath.   Cardiovascular: Negative for chest pain, palpitations and leg swelling.  Gastrointestinal: Negative for abdominal distention, abdominal pain, constipation, diarrhea, nausea and vomiting.  Musculoskeletal: Negative.   Skin: Negative.   Neurological: Negative.   Psychiatric/Behavioral: Negative.     Objective:  Physical Exam Constitutional:      Appearance: She is well-developed and well-nourished.  HENT:     Head: Normocephalic and atraumatic.  Eyes:     Extraocular Movements: EOM normal.  Cardiovascular:     Rate and Rhythm: Normal rate and regular rhythm.  Pulmonary:     Effort: Pulmonary effort is normal. No respiratory distress.     Breath sounds: Normal breath sounds. No wheezing or rales.  Abdominal:     General: Bowel sounds are normal. There is no distension.     Palpations: Abdomen is soft.     Tenderness: There is no abdominal tenderness. There is no rebound.  Musculoskeletal:        General: No edema.     Cervical back: Normal range of motion.  Skin:    General: Skin is warm and dry.   Neurological:     Mental Status: She is alert and oriented to person, place, and time.     Coordination: Coordination normal.  Psychiatric:        Mood and Affect: Mood and affect normal.     Vitals:   06/06/20 0906  BP: 138/76  Pulse: 86  Resp: 18  Temp: 98.5 F (36.9 C)  TempSrc: Oral  SpO2: 99%  Weight: 152 lb 12.8 oz (69.3 kg)  Height: 5' (1.524 m)    This visit occurred during the SARS-CoV-2 public health emergency.  Safety protocols were in place, including screening questions prior to the visit, additional usage of staff PPE, and extensive cleaning of exam room while observing appropriate contact time as indicated for disinfecting solutions.   Assessment & Plan:

## 2020-06-06 NOTE — Patient Instructions (Addendum)
We could start prolia for the bone strength which is a shot that is done every 6 months in the office.    Come back in 6 months and we can get that started if you want. We are checking the labs today and will call you back about the results.

## 2020-06-06 NOTE — Telephone Encounter (Signed)
Patient called and wanted to update her medications list. She can be reached at [phone]

## 2020-06-07 NOTE — Telephone Encounter (Signed)
Unable to get in contact with the patient. LVM asking the patient to return my call here at the office. Office number was provided.  

## 2020-06-08 DIAGNOSIS — I10 Essential (primary) hypertension: Secondary | ICD-10-CM | POA: Insufficient documentation

## 2020-06-08 DIAGNOSIS — E039 Hypothyroidism, unspecified: Secondary | ICD-10-CM | POA: Insufficient documentation

## 2020-06-08 NOTE — Assessment & Plan Note (Signed)
BP at goal on spironolactone and amlodipine and losartan. Checking CMP and adjust as needed. She is at risk for high K given multiple K sparing medications. Adjust as needed.

## 2020-06-08 NOTE — Assessment & Plan Note (Signed)
Checking TSH and adjust synthroid 50 mcg daily as needed. 

## 2020-06-08 NOTE — Assessment & Plan Note (Signed)
Checking CMP for calcium level and checking vitamin D. Counseled about prolia and she will consider this. Return in 6 months for repeat discussion.

## 2020-07-19 ENCOUNTER — Other Ambulatory Visit: Payer: Self-pay

## 2020-07-19 ENCOUNTER — Encounter (INDEPENDENT_AMBULATORY_CARE_PROVIDER_SITE_OTHER): Payer: Medicare Other | Admitting: Ophthalmology

## 2020-07-19 DIAGNOSIS — H353211 Exudative age-related macular degeneration, right eye, with active choroidal neovascularization: Secondary | ICD-10-CM | POA: Diagnosis not present

## 2020-07-19 DIAGNOSIS — I10 Essential (primary) hypertension: Secondary | ICD-10-CM

## 2020-07-19 DIAGNOSIS — D3132 Benign neoplasm of left choroid: Secondary | ICD-10-CM

## 2020-07-19 DIAGNOSIS — H35033 Hypertensive retinopathy, bilateral: Secondary | ICD-10-CM

## 2020-07-19 DIAGNOSIS — H353122 Nonexudative age-related macular degeneration, left eye, intermediate dry stage: Secondary | ICD-10-CM | POA: Diagnosis not present

## 2020-07-19 DIAGNOSIS — H43813 Vitreous degeneration, bilateral: Secondary | ICD-10-CM

## 2020-08-15 ENCOUNTER — Encounter (INDEPENDENT_AMBULATORY_CARE_PROVIDER_SITE_OTHER): Payer: Medicare Other | Admitting: Ophthalmology

## 2020-08-15 ENCOUNTER — Other Ambulatory Visit: Payer: Self-pay

## 2020-08-15 DIAGNOSIS — H353122 Nonexudative age-related macular degeneration, left eye, intermediate dry stage: Secondary | ICD-10-CM | POA: Diagnosis not present

## 2020-08-15 DIAGNOSIS — H353211 Exudative age-related macular degeneration, right eye, with active choroidal neovascularization: Secondary | ICD-10-CM | POA: Diagnosis not present

## 2020-08-15 DIAGNOSIS — D3132 Benign neoplasm of left choroid: Secondary | ICD-10-CM

## 2020-08-15 DIAGNOSIS — I10 Essential (primary) hypertension: Secondary | ICD-10-CM | POA: Diagnosis not present

## 2020-08-15 DIAGNOSIS — H35033 Hypertensive retinopathy, bilateral: Secondary | ICD-10-CM

## 2020-08-15 DIAGNOSIS — H43813 Vitreous degeneration, bilateral: Secondary | ICD-10-CM

## 2020-09-02 ENCOUNTER — Telehealth: Payer: Self-pay | Admitting: Internal Medicine

## 2020-09-02 MED ORDER — LOSARTAN POTASSIUM 100 MG PO TABS: 100.0000 mg | ORAL_TABLET | Freq: Every day | ORAL | 3 refills | Status: DC

## 2020-09-02 NOTE — Telephone Encounter (Signed)
Patient called and said that she takes 25mg  of spironolactone (ALDACTONE) 50 MG tablet instead of the 50mg . She said that she only has one pill left and was wondering if Dr. Sharlet Salina could call the 25mg  in to [place] [place]. Please advise

## 2020-09-02 NOTE — Telephone Encounter (Signed)
Unable to get in contact with the patient. LDVM asking the patient to give our office a call back. Office number was provided. Mychart message will be sent as well.

## 2020-09-02 NOTE — Telephone Encounter (Signed)
Ok to change the dose of spironolactone to 25mg 

## 2020-09-02 NOTE — Telephone Encounter (Signed)
1.Medication Requested:   spironolactone (ALDACTONE) 50 MG tablet [[id number]] needs to be 25mg  previous potassium was high--Pt only has one pill left for tomorrow, requesting be filled today  amLODipine (NORVASC) 10 MG tablet  Not out, but requesting 90 day refills    losartan (COZAAR) 100 MG tablet  Not out, but requesting 90 day refills    2. Pharmacy (Name, [place], [facility]):    [facility], [place] Phone:  [phone]  Fax:  [phone]      3. On Med List: Y  4. Last Visit with PCP: Feb 2022  5. Next visit date with PCP:  Aug 2022   Informed the patient that RX refills may take up to 3 business days. We ask that you follow-up with your pharmacy.   Patient also states that she will need to discuss the levothyroxine (SYNTHROID, LEVOTHROID) 50 MCG tablet at her next visit but just picked up a 90 day supply

## 2020-09-02 NOTE — Telephone Encounter (Signed)
When she was here for visit she told us she was taking 50 mg daily and we checked potassium and it was normal. Did she have it checked elsewhere and it was high? Sent in losartan. Amlodipine was sent in for 90 with 3 refills so should not be due for refill.

## 2020-09-06 MED ORDER — SPIRONOLACTONE 25 MG PO TABS
25.0000 mg | ORAL_TABLET | Freq: Every day | ORAL | 3 refills | Status: DC
Start: 2020-09-06 — End: 2020-12-09

## 2020-09-12 ENCOUNTER — Other Ambulatory Visit: Payer: Self-pay

## 2020-09-12 ENCOUNTER — Encounter (INDEPENDENT_AMBULATORY_CARE_PROVIDER_SITE_OTHER): Payer: Medicare Other | Admitting: Ophthalmology

## 2020-09-12 DIAGNOSIS — H43813 Vitreous degeneration, bilateral: Secondary | ICD-10-CM

## 2020-09-12 DIAGNOSIS — H35033 Hypertensive retinopathy, bilateral: Secondary | ICD-10-CM | POA: Diagnosis not present

## 2020-09-12 DIAGNOSIS — H353211 Exudative age-related macular degeneration, right eye, with active choroidal neovascularization: Secondary | ICD-10-CM

## 2020-09-12 DIAGNOSIS — I10 Essential (primary) hypertension: Secondary | ICD-10-CM

## 2020-09-12 DIAGNOSIS — H353122 Nonexudative age-related macular degeneration, left eye, intermediate dry stage: Secondary | ICD-10-CM | POA: Diagnosis not present

## 2020-09-12 DIAGNOSIS — D3132 Benign neoplasm of left choroid: Secondary | ICD-10-CM

## 2020-10-19 ENCOUNTER — Encounter (INDEPENDENT_AMBULATORY_CARE_PROVIDER_SITE_OTHER): Payer: Medicare Other | Admitting: Ophthalmology

## 2020-10-19 ENCOUNTER — Other Ambulatory Visit: Payer: Self-pay

## 2020-10-19 DIAGNOSIS — H43813 Vitreous degeneration, bilateral: Secondary | ICD-10-CM

## 2020-10-19 DIAGNOSIS — H353122 Nonexudative age-related macular degeneration, left eye, intermediate dry stage: Secondary | ICD-10-CM | POA: Diagnosis not present

## 2020-10-19 DIAGNOSIS — H35033 Hypertensive retinopathy, bilateral: Secondary | ICD-10-CM | POA: Diagnosis not present

## 2020-10-19 DIAGNOSIS — I10 Essential (primary) hypertension: Secondary | ICD-10-CM | POA: Diagnosis not present

## 2020-10-19 DIAGNOSIS — H353211 Exudative age-related macular degeneration, right eye, with active choroidal neovascularization: Secondary | ICD-10-CM

## 2020-10-19 DIAGNOSIS — D3132 Benign neoplasm of left choroid: Secondary | ICD-10-CM

## 2020-11-18 ENCOUNTER — Encounter (INDEPENDENT_AMBULATORY_CARE_PROVIDER_SITE_OTHER): Payer: Medicare Other | Admitting: Ophthalmology

## 2020-11-18 ENCOUNTER — Other Ambulatory Visit: Payer: Self-pay

## 2020-11-18 DIAGNOSIS — D3132 Benign neoplasm of left choroid: Secondary | ICD-10-CM

## 2020-11-18 DIAGNOSIS — H43813 Vitreous degeneration, bilateral: Secondary | ICD-10-CM

## 2020-11-18 DIAGNOSIS — I10 Essential (primary) hypertension: Secondary | ICD-10-CM | POA: Diagnosis not present

## 2020-11-18 DIAGNOSIS — H353231 Exudative age-related macular degeneration, bilateral, with active choroidal neovascularization: Secondary | ICD-10-CM | POA: Diagnosis not present

## 2020-11-18 DIAGNOSIS — H35033 Hypertensive retinopathy, bilateral: Secondary | ICD-10-CM | POA: Diagnosis not present

## 2020-12-05 ENCOUNTER — Ambulatory Visit (INDEPENDENT_AMBULATORY_CARE_PROVIDER_SITE_OTHER): Payer: Medicare Other | Admitting: Internal Medicine

## 2020-12-05 ENCOUNTER — Encounter: Payer: Self-pay | Admitting: Internal Medicine

## 2020-12-05 ENCOUNTER — Other Ambulatory Visit: Payer: Self-pay

## 2020-12-05 VITALS — BP 132/88 | HR 74 | Temp 97.6°F | Resp 18 | Ht 60.0 in | Wt 163.6 lb

## 2020-12-05 DIAGNOSIS — K649 Unspecified hemorrhoids: Secondary | ICD-10-CM

## 2020-12-05 DIAGNOSIS — K219 Gastro-esophageal reflux disease without esophagitis: Secondary | ICD-10-CM

## 2020-12-05 MED ORDER — HYDROCORTISONE (PERIANAL) 2.5 % EX CREA: 1.0000 "application " | TOPICAL_CREAM | Freq: Two times a day (BID) | CUTANEOUS | 0 refills | Status: DC

## 2020-12-05 NOTE — Progress Notes (Signed)
   Subjective:   Patient ID: Alexandra Cruz, female    DOB: 02-Aug-1945, 75 y.o.   MRN: [id number]  HPI The patient is a 75 YO female coming in for several concerns including hemorrhoids recently.   Review of Systems  Constitutional: Negative.   HENT: Negative.    Eyes: Negative.   Respiratory:  Negative for cough, chest tightness and shortness of breath.   Cardiovascular:  Negative for chest pain, palpitations and leg swelling.  Gastrointestinal:  Positive for anal bleeding and rectal pain. Negative for abdominal distention, abdominal pain, constipation, diarrhea, nausea and vomiting.  Musculoskeletal: Negative.   Skin: Negative.   Neurological: Negative.   Psychiatric/Behavioral: Negative.     Objective:  Physical Exam Constitutional:      Appearance: She is well-developed.  HENT:     Head: Normocephalic and atraumatic.  Cardiovascular:     Rate and Rhythm: Normal rate and regular rhythm.  Pulmonary:     Effort: Pulmonary effort is normal. No respiratory distress.     Breath sounds: Normal breath sounds. No wheezing or rales.  Abdominal:     General: Bowel sounds are normal. There is no distension.     Palpations: Abdomen is soft.     Tenderness: There is no abdominal tenderness. There is no rebound.  Genitourinary:    Comments: Several small hemorrhoids protruding on exam, not sclerosed Musculoskeletal:     Cervical back: Normal range of motion.  Skin:    General: Skin is warm and dry.  Neurological:     Mental Status: She is alert and oriented to person, place, and time.     Coordination: Coordination normal.    Vitals:   12/05/20 0952  BP: 132/88  Pulse: 74  Resp: 18  Temp: 97.6 F (36.4 C)  TempSrc: Oral  SpO2: 98%  Weight: 163 lb 9.6 oz (74.2 kg)  Height: 5' (1.524 m)    This visit occurred during the SARS-CoV-2 public health emergency.  Safety protocols were in place, including screening questions prior to the visit, additional usage of staff PPE,  and extensive cleaning of exam room while observing appropriate contact time as indicated for disinfecting solutions.   Assessment & Plan:

## 2020-12-05 NOTE — Patient Instructions (Addendum)
Try adding some probiotics or yogurt to see if this can regulate the bowels.  We have sent in hemorrhoid medicine cream to use twice a day to help those heal.  We will get you in with GI to talk to them about an endoscopy if you need that.

## 2020-12-07 DIAGNOSIS — K649 Unspecified hemorrhoids: Secondary | ICD-10-CM | POA: Insufficient documentation

## 2020-12-07 DIAGNOSIS — K219 Gastro-esophageal reflux disease without esophagitis: Secondary | ICD-10-CM | POA: Insufficient documentation

## 2020-12-07 NOTE — Assessment & Plan Note (Signed)
Wants to see GI and prior endoscopy and was on PPI for a long time. Then she was told to stop this. Has not had typical GERD symptoms but prior hiatal hernia and desires follow up. GI referral done.

## 2020-12-07 NOTE — Assessment & Plan Note (Signed)
Rx anusol ointment for external hemorrhoids which are active due to some recent stomach problems. If recurrent we could discuss banding with GI.

## 2020-12-09 ENCOUNTER — Telehealth: Payer: Self-pay | Admitting: Internal Medicine

## 2020-12-09 MED ORDER — SPIRONOLACTONE 25 MG PO TABS: 25.0000 mg | ORAL_TABLET | Freq: Every day | ORAL | 3 refills | Status: DC

## 2020-12-09 NOTE — Telephone Encounter (Signed)
   Patient requesting refill for  spironolactone (ALDACTONE) 25 MG tablet  levothyroxine (SYNTHROID, LEVOTHROID) 50 MCG tablet    [place], [place]

## 2020-12-09 NOTE — Telephone Encounter (Signed)
Ok to refill Levothyroxine. Please advise

## 2020-12-13 ENCOUNTER — Telehealth: Payer: Self-pay

## 2020-12-13 MED ORDER — LEVOTHYROXINE SODIUM 50 MCG PO TABS: 50.0000 ug | ORAL_TABLET | Freq: Every day | ORAL | 3 refills | Status: DC

## 2020-12-13 NOTE — Addendum Note (Signed)
Addended by: Pricilla Holm A on: 12/13/2020 11:45 AM   Modules accepted: Orders

## 2020-12-14 ENCOUNTER — Telehealth: Payer: Self-pay | Admitting: Internal Medicine

## 2020-12-14 MED ORDER — AMLODIPINE BESYLATE 10 MG PO TABS: 10.0000 mg | ORAL_TABLET | Freq: Every day | ORAL | 3 refills | Status: DC

## 2020-12-14 NOTE — Telephone Encounter (Signed)
1.Medication Requested: amLODipine (NORVASC) 10 MG tablet  2. Pharmacy (Name, [place], [facility]): [address], [place]  Phone:  [phone] Fax:  [phone]   3. On Med List: yes  4. Last Visit with PCP: 08.29.22  5. Next visit date with PCP: n/a   Agent: Please be advised that RX refills may take up to 3 business days. We ask that you follow-up with your pharmacy.

## 2020-12-14 NOTE — Telephone Encounter (Signed)
Medication has been sent to the patient's pharmacy.  

## 2020-12-14 NOTE — Telephone Encounter (Signed)
Medication was sent to the pharmacy yesterday (12/13/2020).

## 2020-12-15 MED ORDER — AMLODIPINE BESYLATE 10 MG PO TABS: 10.0000 mg | ORAL_TABLET | Freq: Every day | ORAL | 3 refills | Status: DC

## 2020-12-15 NOTE — Addendum Note (Signed)
Addended by: Thomes Cake on: 12/15/2020 02:48 PM   Modules accepted: Orders

## 2020-12-15 NOTE — Telephone Encounter (Signed)
Refill has been sent to the pharmacy.  

## 2020-12-15 NOTE — Telephone Encounter (Signed)
   Please resend to pharmacy. They are showing medication denied

## 2020-12-16 ENCOUNTER — Other Ambulatory Visit: Payer: Self-pay | Admitting: Internal Medicine

## 2020-12-16 MED ORDER — AMLODIPINE BESYLATE 10 MG PO TABS: 10.0000 mg | ORAL_TABLET | Freq: Every day | ORAL | 3 refills | Status: DC

## 2020-12-16 NOTE — Addendum Note (Signed)
Addended by: Thomes Cake on: 12/16/2020 04:35 PM   Modules accepted: Orders

## 2020-12-16 NOTE — Telephone Encounter (Signed)
   Please send again to pharmacy. They do not have order amLODipine (NORVASC) 10 MG tablet

## 2020-12-19 ENCOUNTER — Telehealth: Payer: Self-pay

## 2020-12-19 NOTE — Telephone Encounter (Signed)
1.Medication Requested: Amplodipine  2. Pharmacy (Name[place]): [facility] on [address]  3. On Med List: Yes   4. Last Visit with PCP: 12/05/2020  5. Next visit date with PCP:    Patient states the pharmacy states she needs a new prescription.  Agent: Please be advised that RX refills may take up to 3 business days. We ask that you follow-up with your pharmacy.

## 2020-12-20 MED ORDER — AMLODIPINE BESYLATE 10 MG PO TABS: 10.0000 mg | ORAL_TABLET | Freq: Every day | ORAL | 3 refills | Status: DC

## 2020-12-20 NOTE — Telephone Encounter (Signed)
Refill has been sent to the pharmacy.  

## 2020-12-21 NOTE — Telephone Encounter (Signed)
Please resend rx for amLODipine (NORVASC) 10 MG tablet to pharmacy  Pharmacy has not confirmed they received rx & patient says she has been w/ out for 4 days  Pharmacy:  [facility] [place], [place] Phone:  [phone]  Fax:  [phone]

## 2020-12-21 NOTE — Telephone Encounter (Signed)
Spoke with pharmacy to give verbal orders for refill

## 2020-12-23 ENCOUNTER — Encounter (INDEPENDENT_AMBULATORY_CARE_PROVIDER_SITE_OTHER): Payer: Medicare Other | Admitting: Ophthalmology

## 2020-12-23 ENCOUNTER — Other Ambulatory Visit: Payer: Self-pay

## 2020-12-23 DIAGNOSIS — H35033 Hypertensive retinopathy, bilateral: Secondary | ICD-10-CM | POA: Diagnosis not present

## 2020-12-23 DIAGNOSIS — I10 Essential (primary) hypertension: Secondary | ICD-10-CM | POA: Diagnosis not present

## 2020-12-23 DIAGNOSIS — H353231 Exudative age-related macular degeneration, bilateral, with active choroidal neovascularization: Secondary | ICD-10-CM

## 2020-12-23 DIAGNOSIS — D3132 Benign neoplasm of left choroid: Secondary | ICD-10-CM | POA: Diagnosis not present

## 2020-12-23 DIAGNOSIS — H43813 Vitreous degeneration, bilateral: Secondary | ICD-10-CM

## 2020-12-27 ENCOUNTER — Other Ambulatory Visit: Payer: Self-pay | Admitting: Internal Medicine

## 2020-12-27 DIAGNOSIS — Z1231 Encounter for screening mammogram for malignant neoplasm of breast: Secondary | ICD-10-CM

## 2021-01-27 ENCOUNTER — Encounter (INDEPENDENT_AMBULATORY_CARE_PROVIDER_SITE_OTHER): Payer: Medicare Other | Admitting: Ophthalmology

## 2021-01-27 ENCOUNTER — Other Ambulatory Visit: Payer: Self-pay

## 2021-01-27 DIAGNOSIS — I10 Essential (primary) hypertension: Secondary | ICD-10-CM | POA: Diagnosis not present

## 2021-01-27 DIAGNOSIS — H353231 Exudative age-related macular degeneration, bilateral, with active choroidal neovascularization: Secondary | ICD-10-CM

## 2021-01-27 DIAGNOSIS — D3132 Benign neoplasm of left choroid: Secondary | ICD-10-CM

## 2021-01-27 DIAGNOSIS — H35033 Hypertensive retinopathy, bilateral: Secondary | ICD-10-CM | POA: Diagnosis not present

## 2021-01-27 DIAGNOSIS — H43813 Vitreous degeneration, bilateral: Secondary | ICD-10-CM

## 2021-02-01 ENCOUNTER — Other Ambulatory Visit: Payer: Self-pay

## 2021-02-01 ENCOUNTER — Ambulatory Visit
Admission: RE | Admit: 2021-02-01 | Discharge: 2021-02-01 | Disposition: A | Discharge status: Home / Self Care | Payer: Medicare Other | Source: Ambulatory Visit | Attending: Internal Medicine | Admitting: Internal Medicine

## 2021-02-01 DIAGNOSIS — Z1231 Encounter for screening mammogram for malignant neoplasm of breast: Secondary | ICD-10-CM

## 2021-02-07 ENCOUNTER — Telehealth: Payer: Self-pay | Admitting: Internal Medicine

## 2021-02-07 NOTE — Telephone Encounter (Signed)
LVM for pt to rtn my call to schedule AWV with [facility]. Please schedule if pt calls the office.  ?

## 2021-02-10 ENCOUNTER — Encounter: Payer: Self-pay | Admitting: Physician Assistant

## 2021-03-01 ENCOUNTER — Encounter: Payer: Self-pay | Admitting: Physician Assistant

## 2021-03-01 ENCOUNTER — Ambulatory Visit (INDEPENDENT_AMBULATORY_CARE_PROVIDER_SITE_OTHER): Payer: Medicare Other | Admitting: Physician Assistant

## 2021-03-01 VITALS — BP 150/82 | HR 80 | Ht <= 58 in | Wt 167.0 lb

## 2021-03-01 DIAGNOSIS — K219 Gastro-esophageal reflux disease without esophagitis: Secondary | ICD-10-CM

## 2021-03-01 DIAGNOSIS — K2940 Chronic atrophic gastritis without bleeding: Secondary | ICD-10-CM | POA: Diagnosis not present

## 2021-03-01 NOTE — Patient Instructions (Signed)
If you are age 74 or older, your body mass index should be between 23-30. Your Body mass index is 34.9 kg/m. If this is out of the aforementioned range listed, please consider follow up with your Primary Care Provider. ________________________________________________________  The [place] [facility] providers would like to encourage you to use [facility] to communicate with providers for non-urgent requests or questions.  Due to long hold times on the telephone, sending your provider a message by [facility] may be a faster and more efficient way to get a response.  Please allow 48 business hours for a response.  Please remember that this is for non-urgent requests.  _______________________________________________________  Dennis Bast have been scheduled for an endoscopy. Please follow written instructions given to you at your visit today. If you use inhalers (even only as needed), please bring them with you on the day of your procedure.  Use Pepcid 20 mg 1 tablet before dinner.  Follow up pending the results of your Endoscopy.  Thank you for entrusting me with your care and choosing [facility].  Amy Esterwood, PA-C

## 2021-03-01 NOTE — Progress Notes (Signed)
Subjective:    Patient ID: Alexandra Cruz, female    DOB: 04/25/1945, 75 y.o.   MRN: [id number]  HPI Alexandra Cruz is a 75 year old white female, new to GI today self-referred with GERD, and hiatal hernia. She has history of hypertension, hypothyroidism, osteoporosis, diverticulosis and is status post cholecystectomy hysterectomy cystocele repair and hernia repair. She had previously been established with a Santa Lighter, MD/GI [facility], and was last seen there in May 2021. She had colonoscopy and EGD per Dr. Alain Marion in 2020.  Per review of care everywhere colonoscopy was unremarkable.  There was 1 small polyp removed with path showed to be a fragment of benign colonic mucosa.  At EGD she was noted to have LA grade B esophagitis at the GE junction, biopsies were negative for Barrett's, medium sized hiatal hernia, scattered gastritis with erosions and friability with nodular mucosa in the gastric antrum and prepyloric region.  Biopsy showed mild chronic gastritis and focal intestinal metaplasia, no H. pylori, no dysplasia. Patient says that she was on PPI therapy for about a year but was then told by her PCP to come off of this because of diagnosis of osteoporosis.  She is currently not taking anything on a regular basis.  She says she will have heartburn frequently in the evenings but no nocturnal heartburn.  She will have "spells" of daytime heartburn and indigestion, this tends to be aggravated by anything with tomatoes or tomato sauce.  She says if she is careful with her diet and she does not have symptoms on a daily basis.  No complaints of dysphagia or odynophagia.  She is asking whether there is a way to evaluate her esophagus and hiatal hernia etc. short of doing endoscopy. She has mild complaints of constipation intermittently which she can usually manage with fluids and diet.  No melena or hematochezia.  Review of Systems Pertinent positive and negative review of systems were noted in the above HPI  section.  All other review of systems was otherwise negative.   Outpatient Encounter Medications as of 03/01/2021  Medication Sig   amLODipine (NORVASC) 10 MG tablet Take 1 tablet (10 mg total) by mouth daily.   CALCIUM CITRATE-VITAMIN D PO Take 1 tablet by mouth daily.   Cholecalciferol (VITAMIN D3) 75 MCG (3000 UT) TABS Take 1 capsule by mouth daily.   levothyroxine (SYNTHROID) 50 MCG tablet Take 1 tablet (50 mcg total) by mouth daily.   losartan (COZAAR) 100 MG tablet Take 1 tablet (100 mg total) by mouth daily.   Multiple Vitamins-Minerals (PRESERVISION AREDS 2 PO) Take 1 tablet by mouth 2 (two) times daily.   spironolactone (ALDACTONE) 25 MG tablet Take 1 tablet (25 mg total) by mouth daily. (Patient taking differently: Take 12.5 mg by mouth daily.)   trimethoprim-polymyxin b (POLYTRIM) ophthalmic solution SMARTSIG:In Eye(s)   UNABLE TO FIND Place into the right eye every 30 (thirty) days. Med Name: Avastin Steroid Eye injection   [DISCONTINUED] hydrocortisone (ANUSOL-HC) 2.5 % rectal cream Place 1 application rectally 2 (two) times daily.   No facility-administered encounter medications on file as of 03/01/2021.   Allergies  Allergen Reactions   Ace Inhibitors Cough   Bactrim [Sulfamethoxazole-Trimethoprim] Other (See Comments)    hyponatremia   Patient Active Problem List   Diagnosis Date Noted   Hemorrhoids 12/07/2020   Gastroesophageal reflux disease 12/07/2020   Essential hypertension 06/08/2020   Hypothyroidism 06/08/2020   Osteoporosis    Social History   Socioeconomic History   Marital status: Divorced  Spouse name: Not on file   Number of children: 3   Years of education: Not on file   Highest education level: Not on file  Occupational History   Occupation: retired  Tobacco Use   Smoking status: Never   Smokeless tobacco: Never  Vaping Use   Vaping Use: Never used  Substance and Sexual Activity   Alcohol use: No   Drug use: No   Sexual activity: Not on  file  Other Topics Concern   Not on file  Social History Narrative   Born: [place], [place]   Occupation: worked in Furniture conservator/restorer, Clinical cytogeneticist, and Automotive engineer, 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 Kobayashi's family history includes Arthritis in her mother; Asthma in her brother; Colon polyps in her mother; Diabetes in her brother, maternal grandfather, sister, and another family member; Hearing loss in her mother; Heart disease in her sister; Hyperlipidemia in her brother and brother; Hypertension in her brother, mother, sister, and another family member; Liver disease in her daughter; Obesity in an other family member; Other in her brother; Pneumonia in her father.      Objective:    Vitals:   03/01/21 0930  BP: (!) 150/82  Pulse: 80    Physical Exam Well-developed well-nourished elderly white female in no acute distress.  Height, Weight, 167 BMI 34.9  HEENT; nontraumatic normocephalic, EOMI, PE R LA, sclera anicteric. Oropharynx; not examined today Neck; supple, no JVD Cardiovascular; regular rate and rhythm with S1-S2, no murmur rub or gallop Pulmonary; Clear bilaterally Abdomen; soft, nontender, nondistended, no palpable mass or hepatosplenomegaly, bowel sounds are active Rectal; not done today Skin; benign exam, no jaundice rash or appreciable lesions Extremities; no clubbing cyanosis or edema skin warm and dry Neuro/Psych; alert and oriented x4, grossly nonfocal mood and affect appropriate        Assessment & Plan:   #51  75 year old white female with history of chronic GERD and previously documented distal esophagitis at EGD  2020/[place] Patient does not have daily symptoms and is not on any current chronic therapy.  #2 history of gastritis with biopsy showing focal intestinal metaplasia September 2020  #3 colon cancer screening-up-to-date with negative colonoscopy 12/2018 #4 hypothyroidism 5.  Osteoporosis 6.  Status postcholecystectomy, hysterectomy, cystocele repair and hernia repair  Plan; continue antireflux diet Start trial of Pepcid 20 mg p.o. AC dinner Patient will be scheduled for EGD with Dr. Rush Landmark for follow-up of intestinal metaplasia.  Procedure was discussed in detail with the patient including indications risks and benefits and she is agreeable to proceed.  Safia Panzer Genia Harold PA-C 03/01/2021   Cc: Hoyt Koch, *

## 2021-03-02 NOTE — Progress Notes (Signed)
Attending Physician's Attestation   I have reviewed the chart.   I agree with the Advanced Practitioner's note, impression, and recommendations with any updates as below. Agree with follow-up endoscopy to evaluate healing of esophagitis in setting of persistent symptoms as well as gastric mapping in the setting of intestinal metaplasia.  Pending how the patient feels about symptoms she may benefit from potential surgical intervention as her hiatal hernia is noted to be moderate in size so TIF unlikely to be helpful.   Justice Britain, MD [facility] Advanced Endoscopy Office # [phone]

## 2021-03-10 ENCOUNTER — Other Ambulatory Visit: Payer: Self-pay

## 2021-03-10 ENCOUNTER — Encounter (INDEPENDENT_AMBULATORY_CARE_PROVIDER_SITE_OTHER): Payer: Medicare Other | Admitting: Ophthalmology

## 2021-03-10 DIAGNOSIS — H353231 Exudative age-related macular degeneration, bilateral, with active choroidal neovascularization: Secondary | ICD-10-CM

## 2021-03-10 DIAGNOSIS — H35033 Hypertensive retinopathy, bilateral: Secondary | ICD-10-CM | POA: Diagnosis not present

## 2021-03-10 DIAGNOSIS — D3132 Benign neoplasm of left choroid: Secondary | ICD-10-CM | POA: Diagnosis not present

## 2021-03-10 DIAGNOSIS — H43813 Vitreous degeneration, bilateral: Secondary | ICD-10-CM

## 2021-03-10 DIAGNOSIS — I10 Essential (primary) hypertension: Secondary | ICD-10-CM | POA: Diagnosis not present

## 2021-04-13 ENCOUNTER — Encounter: Payer: Medicare Other | Admitting: Gastroenterology

## 2021-04-21 ENCOUNTER — Other Ambulatory Visit: Payer: Self-pay

## 2021-04-21 ENCOUNTER — Encounter (INDEPENDENT_AMBULATORY_CARE_PROVIDER_SITE_OTHER): Payer: Medicare Other | Admitting: Ophthalmology

## 2021-04-21 DIAGNOSIS — D3132 Benign neoplasm of left choroid: Secondary | ICD-10-CM | POA: Diagnosis not present

## 2021-04-21 DIAGNOSIS — I10 Essential (primary) hypertension: Secondary | ICD-10-CM

## 2021-04-21 DIAGNOSIS — H353231 Exudative age-related macular degeneration, bilateral, with active choroidal neovascularization: Secondary | ICD-10-CM

## 2021-04-21 DIAGNOSIS — H35033 Hypertensive retinopathy, bilateral: Secondary | ICD-10-CM

## 2021-04-21 DIAGNOSIS — H43813 Vitreous degeneration, bilateral: Secondary | ICD-10-CM

## 2021-04-24 ENCOUNTER — Encounter: Payer: Self-pay | Admitting: Internal Medicine

## 2021-04-24 ENCOUNTER — Ambulatory Visit (INDEPENDENT_AMBULATORY_CARE_PROVIDER_SITE_OTHER): Payer: Medicare Other | Admitting: Internal Medicine

## 2021-04-24 ENCOUNTER — Other Ambulatory Visit: Payer: Self-pay

## 2021-04-24 VITALS — BP 126/84 | HR 73 | Resp 18 | Ht <= 58 in | Wt 166.4 lb

## 2021-04-24 DIAGNOSIS — E039 Hypothyroidism, unspecified: Secondary | ICD-10-CM | POA: Diagnosis not present

## 2021-04-24 DIAGNOSIS — R7301 Impaired fasting glucose: Secondary | ICD-10-CM | POA: Diagnosis not present

## 2021-04-24 DIAGNOSIS — I10 Essential (primary) hypertension: Secondary | ICD-10-CM

## 2021-04-24 DIAGNOSIS — Z23 Encounter for immunization: Secondary | ICD-10-CM

## 2021-04-24 DIAGNOSIS — J069 Acute upper respiratory infection, unspecified: Secondary | ICD-10-CM | POA: Diagnosis not present

## 2021-04-24 LAB — COMPREHENSIVE METABOLIC PANEL
ALT: 8 U/L (ref 0–35)
AST: 14 U/L (ref 0–37)
Albumin: 3.9 g/dL (ref 3.5–5.2)
Alkaline Phosphatase: 88 U/L (ref 39–117)
BUN: 21 mg/dL (ref 6–23)
CO2: 25 mEq/L (ref 19–32)
Calcium: 9.3 mg/dL (ref 8.4–10.5)
Chloride: 104 mEq/L (ref 96–112)
Creatinine, Ser: 1.01 mg/dL (ref 0.40–1.20)
GFR: 54.71 mL/min — ABNORMAL LOW (ref >60.00)
Glucose, Bld: 92 mg/dL (ref 70–99)
Potassium: 4.5 mEq/L (ref 3.5–5.1)
Sodium: 138 mEq/L (ref 135–145)
Total Bilirubin: 0.5 mg/dL (ref 0.2–1.2)
Total Protein: 7.5 g/dL (ref 6.0–8.3)

## 2021-04-24 LAB — CBC
HCT: 36.8 % (ref 36.0–46.0)
Hemoglobin: 12 g/dL (ref 12.0–15.0)
MCHC: 32.7 g/dL (ref 30.0–36.0)
MCV: 94.9 fl (ref 78.0–100.0)
Platelets: 403 10*3/uL — ABNORMAL HIGH (ref 150.0–400.0)
RBC: 3.87 Mil/uL (ref 3.87–5.11)
RDW: 13.7 % (ref 11.5–15.5)
WBC: 7.9 10*3/uL (ref 4.0–10.5)

## 2021-04-24 LAB — LIPID PANEL
Cholesterol: 151 mg/dL (ref 0–200)
HDL: 82.6 mg/dL (ref >39.00)
LDL Cholesterol: 60 mg/dL (ref 0–99)
NonHDL: 68.66
Total CHOL/HDL Ratio: 2
Triglycerides: 44 mg/dL (ref 0.0–149.0)
VLDL: 8.8 mg/dL (ref 0.0–40.0)

## 2021-04-24 LAB — TSH: TSH: 1.18 u[IU]/mL (ref 0.35–5.50)

## 2021-04-24 LAB — HEMOGLOBIN A1C: Hgb A1c MFr Bld: 6.2 % (ref 4.6–6.5)

## 2021-04-24 NOTE — Progress Notes (Signed)
° °  Subjective:   Patient ID: Alexandra Cruz, female    DOB: 1945-12-08, 76 y.o.   MRN: [id number]  HPI The patient is a 76 YO female coming in for several concerns. BP running average 120s/60s at home. Has cut back spironolactone to 12.5 mg daily for some time.   Review of Systems  Constitutional:  Positive for activity change. Negative for appetite change, chills, fatigue, fever and unexpected weight change.  HENT:  Positive for congestion. Negative for ear discharge, ear pain, sinus pain, sneezing, sore throat, tinnitus, trouble swallowing and voice change.   Eyes: Negative.   Respiratory:  Positive for cough. Negative for chest tightness, shortness of breath and wheezing.   Cardiovascular: Negative.  Negative for chest pain, palpitations and leg swelling.  Gastrointestinal: Negative.  Negative for abdominal distention, abdominal pain, constipation, diarrhea, nausea and vomiting.  Musculoskeletal: Negative.   Skin: Negative.   Neurological: Negative.   Psychiatric/Behavioral: Negative.     Objective:  Physical Exam Constitutional:      Appearance: She is well-developed.  HENT:     Head: Normocephalic and atraumatic.     Comments: Oropharynx with redness and clear drainage, nose with swollen turbinates, TMs normal bilaterally.  Neck:     Thyroid: No thyromegaly.  Cardiovascular:     Rate and Rhythm: Normal rate and regular rhythm.  Pulmonary:     Effort: Pulmonary effort is normal. No respiratory distress.     Breath sounds: Normal breath sounds. No wheezing or rales.  Abdominal:     General: Bowel sounds are normal. There is no distension.     Palpations: Abdomen is soft.     Tenderness: There is no abdominal tenderness. There is no rebound.  Musculoskeletal:     Cervical back: Normal range of motion.  Lymphadenopathy:     Cervical: No cervical adenopathy.  Skin:    General: Skin is warm and dry.  Neurological:     Mental Status: She is alert and oriented to person,  place, and time.     Coordination: Coordination normal.    Vitals:   04/24/21 1041  BP: 126/84  Pulse: 73  Resp: 18  SpO2: 96%  Weight: 166 lb 6.4 oz (75.5 kg)  Height: 4\' 10"  (1.473 m)    This visit occurred during the SARS-CoV-2 public health emergency.  Safety protocols were in place, including screening questions prior to the visit, additional usage of staff PPE, and extensive cleaning of exam room while observing appropriate contact time as indicated for disinfecting solutions.   Assessment & Plan:  Prevnar 20 given at visit

## 2021-04-24 NOTE — Assessment & Plan Note (Signed)
Checking TSH and adjust synthroid 50 mcg daily as needed. 

## 2021-04-24 NOTE — Assessment & Plan Note (Signed)
Overall improving. No antibiotics or steroids indicated. Lungs clear on exam. Advised she is due for pneumonia booster and okay to get that today. Given prevnar 20.

## 2021-04-24 NOTE — Patient Instructions (Addendum)
We will check the labs today. If they are normal we can have you cut back on amlodipine. Then you will monitor the blood pressure and see how this is doing.

## 2021-04-24 NOTE — Assessment & Plan Note (Signed)
BP is normal with losartan 100 mg daily, amlodipine 10 mg daily and spironolactone 12.5 mg daily. Checking CMP. If normal can consider reduction amlodipine to 5 mg daily since BP running 120/60s at home consistently.

## 2021-04-25 ENCOUNTER — Ambulatory Visit: Payer: Medicare Other | Admitting: Internal Medicine

## 2021-05-26 ENCOUNTER — Ambulatory Visit (AMBULATORY_SURGERY_CENTER): Payer: Medicare Other | Admitting: Gastroenterology

## 2021-05-26 ENCOUNTER — Encounter: Payer: Self-pay | Admitting: Gastroenterology

## 2021-05-26 ENCOUNTER — Telehealth: Payer: Self-pay | Admitting: Gastroenterology

## 2021-05-26 VITALS — BP 117/69 | HR 72 | Temp 97.9°F | Resp 15 | Ht <= 58 in | Wt 167.0 lb

## 2021-05-26 DIAGNOSIS — K2970 Gastritis, unspecified, without bleeding: Secondary | ICD-10-CM

## 2021-05-26 DIAGNOSIS — K222 Esophageal obstruction: Secondary | ICD-10-CM

## 2021-05-26 DIAGNOSIS — K219 Gastro-esophageal reflux disease without esophagitis: Secondary | ICD-10-CM

## 2021-05-26 DIAGNOSIS — K259 Gastric ulcer, unspecified as acute or chronic, without hemorrhage or perforation: Secondary | ICD-10-CM | POA: Diagnosis not present

## 2021-05-26 DIAGNOSIS — K2950 Unspecified chronic gastritis without bleeding: Secondary | ICD-10-CM | POA: Diagnosis not present

## 2021-05-26 DIAGNOSIS — K449 Diaphragmatic hernia without obstruction or gangrene: Secondary | ICD-10-CM

## 2021-05-26 HISTORY — PX: UPPER GASTROINTESTINAL ENDOSCOPY: SHX188

## 2021-05-26 MED ORDER — SODIUM CHLORIDE 0.9 % IV SOLN: 500.0000 mL | INTRAVENOUS | Status: DC

## 2021-05-26 MED ORDER — ESOMEPRAZOLE MAGNESIUM 40 MG PO PACK: 40.0000 mg | PACK | Freq: Every day | ORAL | 6 refills | Status: DC

## 2021-05-26 MED ORDER — ESOMEPRAZOLE MAGNESIUM 40 MG PO CPDR: 40.0000 mg | DELAYED_RELEASE_CAPSULE | Freq: Every day | ORAL | 6 refills | Status: DC

## 2021-05-26 NOTE — Progress Notes (Signed)
Called to room to assist during endoscopic procedure.  Patient ID and intended procedure confirmed with present staff. Received instructions for my participation in the procedure from the performing physician.  

## 2021-05-26 NOTE — Telephone Encounter (Signed)
Inbound call from patient daughter. Takes patient was prescribed nexium powder form and it needs to be capsule for insurance to cover

## 2021-05-26 NOTE — Telephone Encounter (Signed)
Medication error corrected and capsules sent to prefer pharmacy.  Patient is aware.

## 2021-05-26 NOTE — Op Note (Signed)
[place] Patient Name: Alexandra Cruz Procedure Date: 05/26/2021 11:42 AM MRN: [id number] Endoscopist: Justice Britain , MD Age: 76 Referring MD:  Date of Birth: December 05, 1945 Gender: Female Account #: [id number] Procedure:                Upper GI endoscopy Indications:              Heartburn, Reflux esophagitis, Follow-up of reflux                            esophagitis, Follow-up of intestinal metaplasia Medicines:                Monitored Anesthesia Care Procedure:                Pre-Anesthesia Assessment:                           - Prior to the procedure, a History and Physical                            was performed, and patient medications and                            allergies were reviewed. The patient's tolerance of                            previous anesthesia was also reviewed. The risks                            and benefits of the procedure and the sedation                            options and risks were discussed with the patient.                            All questions were answered, and informed consent                            was obtained. Prior Anticoagulants: The patient has                            taken no previous anticoagulant or antiplatelet                            agents. ASA Grade Assessment: III - A patient with                            severe systemic disease. After reviewing the risks                            and benefits, the patient was deemed in                            satisfactory condition to undergo the procedure.  After obtaining informed consent, the endoscope was                            passed under direct vision. Throughout the                            procedure, the patient's blood pressure, pulse, and                            oxygen saturations were monitored continuously. The                            GIF [id number] #[id number] was introduced through the                             mouth, and advanced to the second part of duodenum.                            The upper GI endoscopy was accomplished without                            difficulty. The patient tolerated the procedure. Scope In: Scope Out: Findings:                 No gross lesions were noted in the proximal                            esophagus and in the mid esophagus.                           LA Grade B (one or more mucosal breaks greater than                            5 mm, not extending between the tops of two mucosal                            folds) esophagitis with no bleeding was found in                            the distal esophagus extending to the Z-line.                           The Z-line was irregular and was found 33 cm from                            the incisors.                           A low-grade of narrowing Schatzki ring was found at                            the distal esophagus.  A 5 cm hiatal hernia was present.                           This hernia leads to potential                            sliding/paraesophageal hernia deformity was felt to                            be present and visualized in the proximal stomach.                           One non-bleeding superficial gastric ulcer with a                            clean ulcer base (Forrest Class III) was found in                            the gastric body. The lesion was 8 mm in largest                            dimension.                           Segmental moderate inflammation characterized by                            erosions, erythema and granularity was found in the                            entire examined stomach. Due to the patient's                            history of previous intestinal metaplasia, gastric                            mapping was performed. Biopsies were taken with a                            cold forceps for histology from the antrum.                             Biopsies were taken with a cold forceps for                            histology from the incisura. Biopsies were taken                            with a cold forceps for histology from the greater                            curve. Biopsies were taken with a cold forceps for  histology from the lesser curve. Biopsies were                            taken with a cold forceps for histology from the                            cardia/fundus.                           No gross lesions were noted in the duodenal bulb,                            in the first portion of the duodenum and in the                            second portion of the duodenum. Complications:            No immediate complications. Estimated Blood Loss:     Estimated blood loss was minimal. Impression:               - No gross lesions in esophagus proximally. LA                            Grade B esophagitis with no bleeding - distally.                           - Z-line irregular, 33 cm from the incisors.                           - Low-grade of narrowing Schatzki ring.                           - 5 cm hiatal hernia. This leads to an acquired                            likely sliding/paraeosphageal deformity of the                            proximal stomach.                           - Non-bleeding gastric ulcer with a clean ulcer                            base (Forrest Class III) in the body. Gastritis                            noted throughout. History of GIM so gastric mapping                            biopsies performed.                           - No gross lesions in the duodenal bulb, in the  first portion of the duodenum and in the second                            portion of the duodenum. Recommendation:           - The patient will be observed post-procedure,                            until all discharge criteria are met.                           -  Discharge patient to home.                           - Patient has a contact number available for                            emergencies. The signs and symptoms of potential                            delayed complications were discussed with the                            patient. Return to normal activities tomorrow.                            Written discharge instructions were provided to the                            patient.                           - Resume previous diet.                           - Recommend consideration of PPI initiation. At                            least once daily (Nexium 40 mg ) may be worth                            starting to heal the gastric ulcers and gastritis                            and esophagitis. Long term use may increase risk                            for osteopenia or bone density issues, but her body                            suggests that she likely needs this. If she remains                            hesitant then consider H2RA (Pepcid use 1-2 times  daily at least).                           - Observe patient's clinical course.                           - Await pathology results.                           - Consider Barium Swallow in effort of defining the                            sliding +/- paraesophageal herniation noted.                           - Repeat upper endoscopy in 4 months to check                            healing of the gastric ulcer.                           - The findings and recommendations were discussed                            with the patient.                           - The findings and recommendations were discussed                            with the patient's family. Justice Britain, MD 05/26/2021 12:21:03 PM

## 2021-05-26 NOTE — Progress Notes (Signed)
GASTROENTEROLOGY PROCEDURE H&P NOTE   Primary Care Physician: Hoyt Koch, MD  HPI: Alexandra Cruz is a 76 y.o. female who presents for EGD for followoup of Gastric intestinal metaplasia and follow up esophagitis.  Past Medical History:  Diagnosis Date   Adrenal adenoma, right    Anal fissure    Anemia    Anxiety    Colon polyps    Gallstones    GERD (gastroesophageal reflux disease)    Glaucoma    Hypertension    Osteopenia    Pneumonia    Thyroid disease    Past Surgical History:  Procedure Laterality Date   ABDOMINAL HYSTERECTOMY  04/09/1977   BLADDER NECK RECONSTRUCTION     BLADDER SUSPENSION     x2   CATARACT EXTRACTION, BILATERAL Bilateral    CHOLECYSTECTOMY  04/09/1996   INGUINAL HERNIA REPAIR Right 2003, 2007   LAPAROSCOPIC INTERNAL HERNIA REPAIR     spiglian hernia   VAGINAL PROLAPSE REPAIR  04/10/1987   Current Outpatient Medications  Medication Sig Dispense Refill   amLODipine (NORVASC) 10 MG tablet Take 1 tablet (10 mg total) by mouth daily. 90 tablet 3   CALCIUM CITRATE-VITAMIN D PO Take 1 tablet by mouth daily.     Cholecalciferol (VITAMIN D3) 75 MCG (3000 UT) TABS Take 1 capsule by mouth daily.     levothyroxine (SYNTHROID) 50 MCG tablet Take 1 tablet (50 mcg total) by mouth daily. 90 tablet 3   losartan (COZAAR) 100 MG tablet Take 1 tablet (100 mg total) by mouth daily. 90 tablet 3   Multiple Vitamins-Minerals (PRESERVISION AREDS 2 PO) Take 1 tablet by mouth 2 (two) times daily.     spironolactone (ALDACTONE) 25 MG tablet Take 1 tablet (25 mg total) by mouth daily. (Patient taking differently: Take 12.5 mg by mouth daily.) 90 tablet 3   trimethoprim-polymyxin b (POLYTRIM) ophthalmic solution SMARTSIG:In Eye(s)     UNABLE TO FIND Place into the right eye every 30 (thirty) days. Med Name: Avastin Steroid Eye injection     No current facility-administered medications for this visit.    Current Outpatient Medications:    amLODipine  (NORVASC) 10 MG tablet, Take 1 tablet (10 mg total) by mouth daily., Disp: 90 tablet, Rfl: 3   CALCIUM CITRATE-VITAMIN D PO, Take 1 tablet by mouth daily., Disp: , Rfl:    Cholecalciferol (VITAMIN D3) 75 MCG (3000 UT) TABS, Take 1 capsule by mouth daily., Disp: , Rfl:    levothyroxine (SYNTHROID) 50 MCG tablet, Take 1 tablet (50 mcg total) by mouth daily., Disp: 90 tablet, Rfl: 3   losartan (COZAAR) 100 MG tablet, Take 1 tablet (100 mg total) by mouth daily., Disp: 90 tablet, Rfl: 3   Multiple Vitamins-Minerals (PRESERVISION AREDS 2 PO), Take 1 tablet by mouth 2 (two) times daily., Disp: , Rfl:    spironolactone (ALDACTONE) 25 MG tablet, Take 1 tablet (25 mg total) by mouth daily. (Patient taking differently: Take 12.5 mg by mouth daily.), Disp: 90 tablet, Rfl: 3   trimethoprim-polymyxin b (POLYTRIM) ophthalmic solution, SMARTSIG:In Eye(s), Disp: , Rfl:    UNABLE TO FIND, Place into the right eye every 30 (thirty) days. Med Name: Avastin Steroid Eye injection, Disp: , Rfl:  Allergies  Allergen Reactions   Ace Inhibitors Cough   Bactrim [Sulfamethoxazole-Trimethoprim] Other (See Comments)    hyponatremia   Family History  Problem Relation Age of Onset   Hypertension Mother    Arthritis Mother    Hearing loss Mother  Colon polyps Mother    Pneumonia Father    Hypertension Sister    Diabetes Sister    Heart disease Sister    Hyperlipidemia Brother    Diabetes Brother    Hyperlipidemia Brother    Hypertension Brother    Asthma Brother    Other Brother        COVID   Diabetes Maternal Grandfather    Liver disease Daughter        fatty liver   Diabetes Other    Hypertension Other    Obesity Other    Social History   Socioeconomic History   Marital status: Divorced    Spouse name: Not on file   Number of children: 3   Years of education: Not on file   Highest education level: Not on file  Occupational History   Occupation: retired  Tobacco Use   Smoking status: Never    Smokeless tobacco: Never  Vaping Use   Vaping Use: Never used  Substance and Sexual Activity   Alcohol use: No   Drug use: No   Sexual activity: Not on file  Other Topics Concern   Not on file  Social History Narrative   Born: [place], [place]   Occupation: worked in Furniture conservator/restorer, Clinical cytogeneticist, and Automotive engineer, 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 There is no height or weight on file to calculate BMI. GEN: NAD EYE: Sclerae anicteric ENT: MMM CV: Non-tachycardic GI: Soft, NT/ND NEURO:  Alert & Oriented x 3  Lab Results: No results for input(s): WBC, HGB, HCT, PLT in the last 72 hours. BMET No results for input(s): NA, K, CL, CO2, GLUCOSE, BUN, CREATININE, CALCIUM in the last 72 hours. LFT No results for input(s): PROT, ALBUMIN, AST, ALT, ALKPHOS, BILITOT, BILIDIR, IBILI in the last 72 hours. PT/INR No results for input(s): LABPROT, INR in the last 72 hours.   Impression / Plan: This is a 76 y.o.female who presents for EGD for followoup of Gastric intestinal metaplasia and follow up esophagitis.  The risks and benefits of endoscopic evaluation/treatment were discussed with the patient and/or family; these include but are not limited to the risk of perforation, infection, bleeding, missed lesions, lack of diagnosis, severe illness requiring hospitalization, as well as anesthesia and sedation related illnesses.  The patient's history has been reviewed, patient examined, no change in status, and deemed stable for procedure.  The patient and/or family is agreeable to proceed.    Justice Britain, MD [facility] Advanced Endoscopy Office # [phone]

## 2021-05-26 NOTE — Progress Notes (Signed)
Report to PACU, RN, vss, BBS= Clear.  

## 2021-05-26 NOTE — Patient Instructions (Addendum)
Impression/Recommendations:  Esophagitis, gastritis, and hiatal hernia handouts given to patient.  Resume previous diet. Await pathology results.  Intiate Nexium 40 mg. Daily, and observe patient's clinical course.  Consider Barium Swallow.  Repeat upper endoscopy in 4 months to check healing of gastric ulcer.  YOU HAD AN ENDOSCOPIC PROCEDURE TODAY AT [facility]:   Refer to the procedure report that was given to you for any specific questions about what was found during the examination.  If the procedure report does not answer your questions, please call your gastroenterologist to clarify.  If you requested that your care partner not be given the details of your procedure findings, then the procedure report has been included in a sealed envelope for you to review at your convenience later.  YOU SHOULD EXPECT: Some feelings of bloating in the abdomen. Passage of more gas than usual.  Walking can help get rid of the air that was put into your GI tract during the procedure and reduce the bloating. If you had a lower endoscopy (such as a colonoscopy or flexible sigmoidoscopy) you may notice spotting of blood in your stool or on the toilet paper. If you underwent a bowel prep for your procedure, you may not have a normal bowel movement for a few days.  Please Note:  You might notice some irritation and congestion in your nose or some drainage.  This is from the oxygen used during your procedure.  There is no need for concern and it should clear up in a day or so.  SYMPTOMS TO REPORT IMMEDIATELY:    Following upper endoscopy (EGD)  Vomiting of blood or coffee ground material  New chest pain or pain under the shoulder blades  Painful or persistently difficult swallowing  New shortness of breath  Fever of 100F or higher  Black, tarry-looking stools  For urgent or emergent issues, a gastroenterologist can be reached at any hour by calling [phone]. Do not use MyChart  messaging for urgent concerns.    DIET:  We do recommend a small meal at first, but then you may proceed to your regular diet.  Drink plenty of fluids but you should avoid alcoholic beverages for 24 hours.  ACTIVITY:  You should plan to take it easy for the rest of today and you should NOT DRIVE or use heavy machinery until tomorrow (because of the sedation medicines used during the test).    FOLLOW UP: Our staff will call the number listed on your records 48-72 hours following your procedure to check on you and address any questions or concerns that you may have regarding the information given to you following your procedure. If we do not reach you, we will leave a message.  We will attempt to reach you two times.  During this call, we will ask if you have developed any symptoms of COVID 19. If you develop any symptoms (ie: fever, flu-like symptoms, shortness of breath, cough etc.) before then, please call [phone].  If you test positive for Covid 19 in the 2 weeks post procedure, please call and report this information to [place].    If any biopsies were taken you will be contacted by phone or by letter within the next 1-3 weeks.  Please call us at [phone] if you have not heard about the biopsies in 3 weeks.    SIGNATURES/CONFIDENTIALITY: You and/or your care partner have signed paperwork which will be entered into your electronic medical record.  These signatures attest to  the fact that that the information above on your After Visit Summary has been reviewed and is understood.  Full responsibility of the confidentiality of this discharge information lies with you and/or your care-partner.

## 2021-05-31 ENCOUNTER — Encounter: Payer: Self-pay | Admitting: Gastroenterology

## 2021-05-31 ENCOUNTER — Telehealth: Payer: Self-pay

## 2021-05-31 NOTE — Telephone Encounter (Signed)
°  Follow up Call-  Call back number 05/26/2021  Post procedure Call Back phone  # [phone]  Permission to leave phone message Yes  Some recent data might be hidden     Patient questions:  Do you have a fever, pain , or abdominal swelling? No. Pain Score  0 *  Have you tolerated food without any problems? Yes.    Have you been able to return to your normal activities? Yes.    Do you have any questions about your discharge instructions: Diet   No. Medications  No. Follow up visit  No.  Do you have questions or concerns about your Care? No.  Actions: * If pain score is 4 or above: No action needed, pain <4.

## 2021-06-09 ENCOUNTER — Other Ambulatory Visit: Payer: Self-pay

## 2021-06-09 ENCOUNTER — Encounter (INDEPENDENT_AMBULATORY_CARE_PROVIDER_SITE_OTHER): Payer: Medicare Other | Admitting: Ophthalmology

## 2021-06-09 DIAGNOSIS — H35033 Hypertensive retinopathy, bilateral: Secondary | ICD-10-CM

## 2021-06-09 DIAGNOSIS — H353231 Exudative age-related macular degeneration, bilateral, with active choroidal neovascularization: Secondary | ICD-10-CM

## 2021-06-09 DIAGNOSIS — H43813 Vitreous degeneration, bilateral: Secondary | ICD-10-CM | POA: Diagnosis not present

## 2021-06-09 DIAGNOSIS — D3132 Benign neoplasm of left choroid: Secondary | ICD-10-CM

## 2021-06-09 DIAGNOSIS — I10 Essential (primary) hypertension: Secondary | ICD-10-CM

## 2021-07-18 ENCOUNTER — Encounter: Payer: Self-pay | Admitting: Gastroenterology

## 2021-07-31 ENCOUNTER — Encounter (INDEPENDENT_AMBULATORY_CARE_PROVIDER_SITE_OTHER): Payer: Medicare Other | Admitting: Ophthalmology

## 2021-07-31 DIAGNOSIS — H353231 Exudative age-related macular degeneration, bilateral, with active choroidal neovascularization: Secondary | ICD-10-CM

## 2021-07-31 DIAGNOSIS — I10 Essential (primary) hypertension: Secondary | ICD-10-CM

## 2021-07-31 DIAGNOSIS — H43813 Vitreous degeneration, bilateral: Secondary | ICD-10-CM | POA: Diagnosis not present

## 2021-07-31 DIAGNOSIS — H35033 Hypertensive retinopathy, bilateral: Secondary | ICD-10-CM

## 2021-07-31 DIAGNOSIS — D3132 Benign neoplasm of left choroid: Secondary | ICD-10-CM

## 2021-08-03 ENCOUNTER — Ambulatory Visit (INDEPENDENT_AMBULATORY_CARE_PROVIDER_SITE_OTHER): Payer: Medicare Other

## 2021-08-03 DIAGNOSIS — Z0000 Encounter for general adult medical examination without abnormal findings: Secondary | ICD-10-CM

## 2021-08-03 DIAGNOSIS — Z1231 Encounter for screening mammogram for malignant neoplasm of breast: Secondary | ICD-10-CM

## 2021-08-03 DIAGNOSIS — M810 Age-related osteoporosis without current pathological fracture: Secondary | ICD-10-CM | POA: Diagnosis not present

## 2021-08-03 DIAGNOSIS — Z1239 Encounter for other screening for malignant neoplasm of breast: Secondary | ICD-10-CM | POA: Diagnosis not present

## 2021-08-03 DIAGNOSIS — Z13820 Encounter for screening for osteoporosis: Secondary | ICD-10-CM | POA: Diagnosis not present

## 2021-08-03 NOTE — Progress Notes (Signed)
?I connected with Alexandra Cruz today by telephone and verified that I am speaking with the correct person using two identifiers. ?Location patient: home ?Location provider: work ?Persons participating in the virtual visit: patient, provider. ?  ?I discussed the limitations, risks, security and privacy concerns of performing an evaluation and management service by telephone and the availability of in person appointments. I also discussed with the patient that there may be a patient responsible charge related to this service. The patient expressed understanding and verbally consented to this telephonic visit.  ?  ?Interactive audio and video telecommunications were attempted between this provider and patient, however failed, due to patient having technical difficulties OR patient did not have access to video capability.  We continued and completed visit with audio only. ? ?Some vital signs may be absent or patient reported.  ? ?Time Spent with patient on telephone encounter: 45 minutes ? ?Subjective:  ? Alexandra Cruz is a 76 y.o. female who presents for an Initial Medicare Annual Wellness Visit. ? ?Review of Systems    ? ?Cardiac Risk Factors include: advanced age (>94mn, >>81women);hypertension;family history of premature cardiovascular disease ? ?   ?Objective:  ?  ?There were no vitals filed for this visit. ?There is no height or weight on file to calculate BMI. ? ? ?  08/03/2021  ? 10:21 AM 07/12/2014  ?  6:32 PM 10/31/2011  ?  6:50 AM  ?Advanced Directives  ?Does Patient Have a Medical Advance Directive? No No Patient does not have advance directive;Patient would not like information  ?Would patient like information on creating a medical advance directive? No - Patient declined    ?Pre-existing out of facility DNR order (yellow form or pink MOST form)   No  ? ? ?Current Medications (verified) ?Outpatient Encounter Medications as of 08/03/2021  ?Medication Sig  ? amLODipine (NORVASC) 10 MG tablet Take 1  tablet (10 mg total) by mouth daily.  ? CALCIUM CITRATE-VITAMIN D PO Take 1 tablet by mouth daily.  ? Cholecalciferol (VITAMIN D3) 75 MCG (3000 UT) TABS Take 1 capsule by mouth daily.  ? esomeprazole (NEXIUM) 40 MG capsule Take 1 capsule (40 mg total) by mouth daily.  ? levothyroxine (SYNTHROID) 50 MCG tablet Take 1 tablet (50 mcg total) by mouth daily.  ? losartan (COZAAR) 100 MG tablet Take 1 tablet (100 mg total) by mouth daily.  ? Multiple Vitamins-Minerals (PRESERVISION AREDS 2 PO) Take 1 tablet by mouth 2 (two) times daily.  ? spironolactone (ALDACTONE) 25 MG tablet Take 1 tablet (25 mg total) by mouth daily. (Patient taking differently: Take 12.5 mg by mouth daily.)  ? trimethoprim-polymyxin b (POLYTRIM) ophthalmic solution SMARTSIG:In Eye(s)  ? UNABLE TO FIND Place into the right eye every 30 (thirty) days. Med Name: Avastin Steroid Eye injection  ? ?No facility-administered encounter medications on file as of 08/03/2021.  ? ? ?Allergies (verified) ?Ace inhibitors and Bactrim [sulfamethoxazole-trimethoprim]  ? ?History: ?Past Medical History:  ?Diagnosis Date  ? Adrenal adenoma, right   ? Anal fissure   ? Anemia   ? Anxiety   ? Colon polyps   ? Gallstones   ? GERD (gastroesophageal reflux disease)   ? Glaucoma   ? Hypertension   ? Osteopenia   ? Osteoporosis   ? Pneumonia   ? Thyroid disease   ? ?Past Surgical History:  ?Procedure Laterality Date  ? ABDOMINAL HYSTERECTOMY  04/09/1977  ? BLADDER NECK RECONSTRUCTION    ? BLADDER SUSPENSION    ? x2  ?  CATARACT EXTRACTION, BILATERAL Bilateral   ? CHOLECYSTECTOMY  04/09/1996  ? INGUINAL HERNIA REPAIR Right 2003, 2007  ? LAPAROSCOPIC INTERNAL HERNIA REPAIR    ? spiglian hernia  ? VAGINAL PROLAPSE REPAIR  04/10/1987  ? ?Family History  ?Problem Relation Age of Onset  ? Hypertension Mother   ? Arthritis Mother   ? Hearing loss Mother   ? Colon polyps Mother   ? Pneumonia Father   ? Hypertension Sister   ? Diabetes Sister   ? Heart disease Sister   ? Hyperlipidemia  Brother   ? Diabetes Brother   ? Hyperlipidemia Brother   ? Hypertension Brother   ? Asthma Brother   ? Other Brother   ?     COVID  ? Diabetes Maternal Grandfather   ? Liver disease Daughter   ?     fatty liver  ? Diabetes Other   ? Hypertension Other   ? Obesity Other   ? Esophageal cancer Neg Hx   ? ?Social History  ? ?Socioeconomic History  ? Marital status: Divorced  ?  Spouse name: Not on file  ? Number of children: 3  ? Years of education: Not on file  ? Highest education level: Not on file  ?Occupational History  ? Occupation: retired  ?Tobacco Use  ? Smoking status: Never  ? Smokeless tobacco: Never  ?Vaping Use  ? Vaping Use: Never used  ?Substance and Sexual Activity  ? Alcohol use: No  ? Drug use: No  ? Sexual activity: Not on file  ?Other Topics Concern  ? Not on file  ?Social History Narrative  ? Born: [place], [place]  ? 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 Divorced  ? 3 children, 1 passed away at age 48 Manufacturing engineer), 2 daughters alive  ? ?Social Determinants of Health  ? ?Financial Resource Strain: Low Risk   ? Difficulty of Paying Living Expenses: Not hard at all  ?Food Insecurity: No Food Insecurity  ? Worried About Charity fundraiser in the Last Year: Never true  ? Ran Out of Food in the Last Year: Never true  ?Transportation Needs: No Transportation Needs  ? Lack of Transportation (Medical): No  ? Lack of Transportation (Non-Medical): No  ?Physical Activity: Sufficiently Active  ? Days of Exercise per Week: 5 days  ? Minutes of Exercise per Session: 30 min  ?Stress: No Stress Concern Present  ? Feeling of Stress : Not at all  ?Social Connections: Moderately Isolated  ? Frequency of Communication with Friends and Family: More than three times a week  ? Frequency of Social Gatherings with Friends and Family: More than three times a week  ? Attends Religious Services: More than 4 times per year  ? Active Member of Clubs or Organizations: No  ?  Attends Archivist Meetings: Never  ? Marital Status: Divorced  ? ? ?Tobacco Counseling ?Counseling given: Not Answered ? ? ?Clinical Intake: ? ?Pre-visit preparation completed: Yes ? ?Pain : No/denies pain ? ?  ? ?Nutritional Risks: Other (Comment) ?Diabetes: No ? ?How often do you need to have someone help you when you read instructions, pamphlets, or other written materials from your doctor or pharmacy?: 1 - Never ?What is the last grade level you completed in school?: HSG ? ?Diabetic? Pre-diabetic ? ?Interpreter Needed?: No ? ?Information entered by :: Lisette Abu, LPN ? ? ?Activities of Daily Living ? ?  08/03/2021  ? 10:39  AM  ?In your present state of health, do you have any difficulty performing the following activities:  ?Hearing? 1  ?Vision? 0  ?Difficulty concentrating or making decisions? 0  ?Walking or climbing stairs? 0  ?Dressing or bathing? 0  ?Doing errands, shopping? 0  ?Preparing Food and eating ? N  ?Using the Toilet? N  ?In the past six months, have you accidently leaked urine? N  ?Do you have problems with loss of bowel control? N  ?Managing your Medications? N  ?Managing your Finances? N  ?Housekeeping or managing your Housekeeping? N  ? ? ?Patient Care Team: ?Hoyt Koch, MD as PCP - General (Internal Medicine) ? ?Indicate any recent Medical Services you may have received from other than [facility] providers in the past year (date may be approximate). ? ?   ?Assessment:  ? This is a routine wellness examination for [place]. ? ?Hearing/Vision screen ?No results found. ? ?Dietary issues and exercise activities discussed: ?Current Exercise Habits: Home exercise routine, Type of exercise: walking, Time (Minutes): 30, Frequency (Times/Week): 5, Weekly Exercise (Minutes/Week): 150, Intensity: Moderate, Exercise limited by: None identified ? ? Goals Addressed   ? ?  ?  ?  ?  ?  ? This Visit's Progress  ?   Watch my diet due to prediabetes and hiatal hernia. (pt-stated)     ?   I  have started being more physically active, cut out my sweets/snacks and eat more leafy green vegetables. ?  ? ?  ?Depression Screen ? ?  08/03/2021  ? 10:32 AM 06/06/2020  ?  9:12 AM  ?PHQ 2/9 Scores  ?PHQ - 2 Score

## 2021-08-03 NOTE — Patient Instructions (Signed)
Alexandra Cruz , ?Thank you for taking time to come for your Medicare Wellness Visit. I appreciate your ongoing commitment to your health goals. Please review the following plan we discussed and let me know if I can assist you in the future.  ? ?Screening recommendations/referrals: ?Colonoscopy: never done ?Mammogram: order placed 08/03/2021 ?Bone Density: order placed 08/03/2021 ?Recommended yearly ophthalmology/optometry visit for glaucoma screening and checkup ?Recommended yearly dental visit for hygiene and checkup ? ?Vaccinations: ?Influenza vaccine: declined ?Pneumococcal vaccine: 04/24/2021 ?Tdap vaccine: declined ?Shingles vaccine: declined   ?Covid-19: declined ? ?Advanced directives: No ? ?Conditions/risks identified: Yes ? ?Next appointment: Please schedule your next Medicare Wellness Visit with your Nurse Health Advisor in 1 year by calling [phone]. ? ? ?Preventive Care 75 Years and Older, Female ?Preventive care refers to lifestyle choices and visits with your health care provider that can promote health and wellness. ?What does preventive care include? ?A yearly physical exam. This is also called an annual well check. ?Dental exams once or twice a year. ?Routine eye exams. Ask your health care provider how often you should have your eyes checked. ?Personal lifestyle choices, including: ?Daily care of your teeth and gums. ?Regular physical activity. ?Eating a healthy diet. ?Avoiding tobacco and drug use. ?Limiting alcohol use. ?Practicing safe sex. ?Taking low-dose aspirin every day. ?Taking vitamin and mineral supplements as recommended by your health care provider. ?What happens during an annual well check? ?The services and screenings done by your health care provider during your annual well check will depend on your age, overall health, lifestyle risk factors, and family history of disease. ?Counseling  ?Your health care provider may ask you questions about your: ?Alcohol use. ?Tobacco use. ?Drug  use. ?Emotional well-being. ?Home and relationship well-being. ?Sexual activity. ?Eating habits. ?History of falls. ?Memory and ability to understand (cognition). ?Work and work Statistician. ?Reproductive health. ?Screening  ?You may have the following tests or measurements: ?Height, weight, and BMI. ?Blood pressure. ?Lipid and cholesterol levels. These may be checked every 5 years, or more frequently if you are over 63 years old. ?Skin check. ?Lung cancer screening. You may have this screening every year starting at age 14 if you have a 30-pack-year history of smoking and currently smoke or have quit within the past 15 years. ?Fecal occult blood test (FOBT) of the stool. You may have this test every year starting at age 32. ?Flexible sigmoidoscopy or colonoscopy. You may have a sigmoidoscopy every 5 years or a colonoscopy every 10 years starting at age 41. ?Hepatitis C blood test. ?Hepatitis B blood test. ?Sexually transmitted disease (STD) testing. ?Diabetes screening. This is done by checking your blood sugar (glucose) after you have not eaten for a while (fasting). You may have this done every 1-3 years. ?Bone density scan. This is done to screen for osteoporosis. You may have this done starting at age 58. ?Mammogram. This may be done every 1-2 years. Talk to your health care provider about how often you should have regular mammograms. ?Talk with your health care provider about your test results, treatment options, and if necessary, the need for more tests. ?Vaccines  ?Your health care provider may recommend certain vaccines, such as: ?Influenza vaccine. This is recommended every year. ?Tetanus, diphtheria, and acellular pertussis (Tdap, Td) vaccine. You may need a Td booster every 10 years. ?Zoster vaccine. You may need this after age 74. ?Pneumococcal 13-valent conjugate (PCV13) vaccine. One dose is recommended after age 23. ?Pneumococcal polysaccharide (PPSV23) vaccine. One dose is recommended after age  69. ?Talk to your health care provider about which screenings and vaccines you need and how often you need them. ?This information is not intended to replace advice given to you by your health care provider. Make sure you discuss any questions you have with your health care provider. ?Document Released: 04/22/2015 Document Revised: 12/14/2015 Document Reviewed: 01/25/2015 ?Elsevier Interactive Patient Education ? 2017 [place]. ? ?Fall Prevention in the Home ?Falls can cause injuries. They can happen to people of all ages. There are many things you can do to make your home safe and to help prevent falls. ?What can I do on the outside of my home? ?Regularly fix the edges of walkways and driveways and fix any cracks. ?Remove anything that might make you trip as you walk through a door, such as a raised step or threshold. ?Trim any bushes or trees on the path to your home. ?Use bright outdoor lighting. ?Clear any walking paths of anything that might make someone trip, such as rocks or tools. ?Regularly check to see if handrails are loose or broken. Make sure that both sides of any steps have handrails. ?Any raised decks and porches should have guardrails on the edges. ?Have any leaves, snow, or ice cleared regularly. ?Use sand or salt on walking paths during winter. ?Clean up any spills in your garage right away. This includes oil or grease spills. ?What can I do in the bathroom? ?Use night lights. ?Install grab bars by the toilet and in the tub and shower. Do not use towel bars as grab bars. ?Use non-skid mats or decals in the tub or shower. ?If you need to sit down in the shower, use a plastic, non-slip stool. ?Keep the floor dry. Clean up any water that spills on the floor as soon as it happens. ?Remove soap buildup in the tub or shower regularly. ?Attach bath mats securely with double-sided non-slip rug tape. ?Do not have throw rugs and other things on the floor that can make you trip. ?What can I do in the  bedroom? ?Use night lights. ?Make sure that you have a light by your bed that is easy to reach. ?Do not use any sheets or blankets that are too big for your bed. They should not hang down onto the floor. ?Have a firm chair that has side arms. You can use this for support while you get dressed. ?Do not have throw rugs and other things on the floor that can make you trip. ?What can I do in the kitchen? ?Clean up any spills right away. ?Avoid walking on wet floors. ?Keep items that you use a lot in easy-to-reach places. ?If you need to reach something above you, use a strong step stool that has a grab bar. ?Keep electrical cords out of the way. ?Do not use floor polish or wax that makes floors slippery. If you must use wax, use non-skid floor wax. ?Do not have throw rugs and other things on the floor that can make you trip. ?What can I do with my stairs? ?Do not leave any items on the stairs. ?Make sure that there are handrails on both sides of the stairs and use them. Fix handrails that are broken or loose. Make sure that handrails are as long as the stairways. ?Check any carpeting to make sure that it is firmly attached to the stairs. Fix any carpet that is loose or worn. ?Avoid having throw rugs at the top or bottom of the stairs. If you do have throw  rugs, attach them to the floor with carpet tape. ?Make sure that you have a light switch at the top of the stairs and the bottom of the stairs. If you do not have them, ask someone to add them for you. ?What else can I do to help prevent falls? ?Wear shoes that: ?Do not have high heels. ?Have rubber bottoms. ?Are comfortable and fit you well. ?Are closed at the toe. Do not wear sandals. ?If you use a stepladder: ?Make sure that it is fully opened. Do not climb a closed stepladder. ?Make sure that both sides of the stepladder are locked into place. ?Ask someone to hold it for you, if possible. ?Clearly mark and make sure that you can see: ?Any grab bars or  handrails. ?First and last steps. ?Where the edge of each step is. ?Use tools that help you move around (mobility aids) if they are needed. These include: ?Canes. ?Walkers. ?Scooters. ?Crutches. ?Turn on the lights when yo

## 2021-09-04 ENCOUNTER — Other Ambulatory Visit: Payer: Self-pay | Admitting: Internal Medicine

## 2021-09-08 ENCOUNTER — Ambulatory Visit (AMBULATORY_SURGERY_CENTER): Payer: Medicare Other | Admitting: *Deleted

## 2021-09-08 VITALS — Ht <= 58 in | Wt 166.0 lb

## 2021-09-08 DIAGNOSIS — K253 Acute gastric ulcer without hemorrhage or perforation: Secondary | ICD-10-CM

## 2021-09-08 NOTE — Progress Notes (Signed)
Patient's pre-visit was done today over the phone with the patient. Name,DOB and address verified. Patient denies any allergies to Eggs and Soy. Patient denies any problems with anesthesia/sedation. Patient is not taking any diet pills or blood thinners. No home Oxygen.  Prep instructions sent to pt's MyChart -pt is aware. Patient understands to call us back with any questions or concerns. Patient is aware of our care-partner policy.   EMMI education assigned to the patient for the procedure, sent to [facility].

## 2021-09-17 ENCOUNTER — Encounter: Payer: Self-pay | Admitting: Certified Registered Nurse Anesthetist

## 2021-09-22 ENCOUNTER — Ambulatory Visit (AMBULATORY_SURGERY_CENTER): Payer: Medicare Other | Admitting: Gastroenterology

## 2021-09-22 ENCOUNTER — Encounter: Payer: Self-pay | Admitting: Gastroenterology

## 2021-09-22 VITALS — BP 126/63 | HR 69 | Temp 97.3°F | Resp 16 | Ht <= 58 in | Wt 166.0 lb

## 2021-09-22 DIAGNOSIS — K449 Diaphragmatic hernia without obstruction or gangrene: Secondary | ICD-10-CM | POA: Diagnosis not present

## 2021-09-22 DIAGNOSIS — K2940 Chronic atrophic gastritis without bleeding: Secondary | ICD-10-CM

## 2021-09-22 DIAGNOSIS — K253 Acute gastric ulcer without hemorrhage or perforation: Secondary | ICD-10-CM

## 2021-09-22 DIAGNOSIS — K219 Gastro-esophageal reflux disease without esophagitis: Secondary | ICD-10-CM | POA: Diagnosis not present

## 2021-09-22 MED ORDER — ESOMEPRAZOLE MAGNESIUM 20 MG PO PACK
20.0000 mg | PACK | Freq: Every day | ORAL | 12 refills | Status: DC
Start: 2021-09-22 — End: 2021-09-28

## 2021-09-22 MED ORDER — SODIUM CHLORIDE 0.9 % IV SOLN: 500.0000 mL | Freq: Once | INTRAVENOUS | Status: DC

## 2021-09-22 NOTE — Op Note (Signed)
[facility] Patient Name: Alexandra Cruz Procedure Date: 09/22/2021 12:55 PM MRN: [id number] Endoscopist: Justice Britain , MD Age: 76 Referring MD:  Date of Birth: 02/22/1946 Gender: Female Account #: [id number] Procedure:                Upper GI endoscopy Indications:              Follow-up of gastric ulcer, Follow-up of                            esophagitis, Intestinal metaplasia Medicines:                Monitored Anesthesia Care Procedure:                Pre-Anesthesia Assessment:                           - Prior to the procedure, a History and Physical                            was performed, and patient medications and                            allergies were reviewed. The patient's tolerance of                            previous anesthesia was also reviewed. The risks                            and benefits of the procedure and the sedation                            options and risks were discussed with the patient.                            All questions were answered, and informed consent                            was obtained. Prior Anticoagulants: The patient has                            taken no previous anticoagulant or antiplatelet                            agents. ASA Grade Assessment: II - A patient with                            mild systemic disease. After reviewing the risks                            and benefits, the patient was deemed in                            satisfactory condition to undergo the procedure.  After obtaining informed consent, the endoscope was                            passed under direct vision. Throughout the                            procedure, the patient's blood pressure, pulse, and                            oxygen saturations were monitored continuously. The                            GIF [id number] was introduced through the                            mouth, and advanced to  the second part of duodenum.                            The upper GI endoscopy was accomplished without                            difficulty. The patient tolerated the procedure. Scope In: Scope Out: Findings:                 No gross lesions were noted in the entire esophagus.                           The Z-line was irregular and was found 35 cm from                            the incisors.                           A 5 cm hiatal hernia was present.                           Normal mucosa was found in the entire examined                            stomach.                           No gross lesions were noted in the duodenal bulb,                            in the first portion of the duodenum and in the                            second portion of the duodenum. Complications:            No immediate complications. Estimated Blood Loss:     Estimated blood loss: none. Impression:               - No gross lesions in esophagus.                           -  Z-line irregular, 35 cm from the incisors.                           - 5 cm hiatal hernia.                           - Normal mucosa was found in the entire stomach.                           - No gross lesions in the duodenal bulb, in the                            first portion of the duodenum and in the second                            portion of the duodenum. Recommendation:           - The patient will be observed post-procedure,                            until all discharge criteria are met.                           - Discharge patient to home.                           - Patient has a contact number available for                            emergencies. The signs and symptoms of potential                            delayed complications were discussed with the                            patient. Return to normal activities tomorrow.                            Written discharge instructions were provided to the                             patient.                           - Resume previous diet.                           - Complete Nexium 40 mg daily through June and then                            start decreasing to 20 mg daily                           - Follow up in clinic to determine continued needs  thereafter.                           - The findings and recommendations were discussed                            with the patient.                           - The findings and recommendations were discussed                            with the patient's family. Justice Britain, MD 09/22/2021 1:29:07 PM

## 2021-09-22 NOTE — Progress Notes (Signed)
Pt's states no medical or surgical changes since previsit or office visit. 

## 2021-09-22 NOTE — Patient Instructions (Signed)
YOU HAD AN ENDOSCOPIC PROCEDURE TODAY AT [facility]:   Refer to the procedure report that was given to you for any specific questions about what was found during the examination.  If the procedure report does not answer your questions, please call your gastroenterologist to clarify.  If you requested that your care partner not be given the details of your procedure findings, then the procedure report has been included in a sealed envelope for you to review at your convenience later.  YOU SHOULD EXPECT: Some feelings of bloating in the abdomen. Passage of more gas than usual.  Walking can help get rid of the air that was put into your GI tract during the procedure and reduce the bloating. If you had a lower endoscopy (such as a colonoscopy or flexible sigmoidoscopy) you may notice spotting of blood in your stool or on the toilet paper. If you underwent a bowel prep for your procedure, you may not have a normal bowel movement for a few days.  Please Note:  You might notice some irritation and congestion in your nose or some drainage.  This is from the oxygen used during your procedure.  There is no need for concern and it should clear up in a day or so.  SYMPTOMS TO REPORT IMMEDIATELY:   Following upper endoscopy (EGD)  Vomiting of blood or coffee ground material  New chest pain or pain under the shoulder blades  Painful or persistently difficult swallowing  New shortness of breath  Fever of 100F or higher  Black, tarry-looking stools  For urgent or emergent issues, a gastroenterologist can be reached at any hour by calling [phone]. Do not use MyChart messaging for urgent concerns.    DIET:  We do recommend a small meal at first, but then you may proceed to your regular diet.  Drink plenty of fluids but you should avoid alcoholic beverages for 24 hours.  MEDICATIONS: Complete Nexium 40 mg by mouth daily through June and then start decreasing to 20 mg by mouth daily.    FOLLOW UP: Follow up in clinic to determine your continued needs thereafter.  Please see handouts given to you by your recovery nurse.  Thank you for allowing [place] to provide for your healthcare needs today.  ACTIVITY:  You should plan to take it easy for the rest of today and you should NOT DRIVE or use heavy machinery until tomorrow (because of the sedation medicines used during the test).    FOLLOW UP: Our staff will call the number listed on your records 24-72 hours following your procedure to check on you and address any questions or concerns that you may have regarding the information given to you following your procedure. If we do not reach you, we will leave a message.  We will attempt to reach you two times.  During this call, we will ask if you have developed any symptoms of COVID 19. If you develop any symptoms (ie: fever, flu-like symptoms, shortness of breath, cough etc.) before then, please call [phone].  If you test positive for Covid 19 in the 2 weeks post procedure, please call and report this information to [place].    If any biopsies were taken you will be contacted by phone or by letter within the next 1-3 weeks.  Please call us at [phone] if you have not heard about the biopsies in 3 weeks.    SIGNATURES/CONFIDENTIALITY: You and/or your care partner have signed paperwork which will be  entered into your electronic medical record.  These signatures attest to the fact that that the information above on your After Visit Summary has been reviewed and is understood.  Full responsibility of the confidentiality of this discharge information lies with you and/or your care-partner.

## 2021-09-22 NOTE — Progress Notes (Signed)
GASTROENTEROLOGY PROCEDURE H&P NOTE   Primary Care Physician: Hoyt Koch, MD  HPI: Alexandra Cruz is a 76 y.o. female who presents for EGD for follow-up of previous gastric ulcer and previous history of GIM (last full endoscopy this year in 2/23 showed no further evidence of intestinal metaplasia on gastric mapping).  Past Medical History:  Diagnosis Date   Adrenal adenoma, right    Anal fissure    Anemia    Anxiety    Colon polyps    Gallstones    GERD (gastroesophageal reflux disease)    Glaucoma    Hypertension    Osteopenia    Osteoporosis    Pneumonia    Thyroid disease    Past Surgical History:  Procedure Laterality Date   ABDOMINAL HYSTERECTOMY  04/09/1977   BLADDER NECK RECONSTRUCTION     BLADDER SUSPENSION     x2   CATARACT EXTRACTION, BILATERAL Bilateral    CHOLECYSTECTOMY  04/09/1996   INGUINAL HERNIA REPAIR Right 2003, 2007   LAPAROSCOPIC INTERNAL HERNIA REPAIR     spiglian hernia   UPPER GASTROINTESTINAL ENDOSCOPY  05/26/2021   Dr.Mansouraty   VAGINAL PROLAPSE REPAIR  04/10/1987   Current Outpatient Medications  Medication Sig Dispense Refill   amLODipine (NORVASC) 10 MG tablet Take 1 tablet (10 mg total) by mouth daily. 90 tablet 3   CALCIUM CITRATE-VITAMIN D PO Take 1 tablet by mouth daily.     Cholecalciferol (VITAMIN D3) 75 MCG (3000 UT) TABS Take 1 capsule by mouth daily.     esomeprazole (NEXIUM) 40 MG capsule Take 1 capsule (40 mg total) by mouth daily. 30 capsule 6   levothyroxine (SYNTHROID) 50 MCG tablet Take 1 tablet (50 mcg total) by mouth daily. 90 tablet 3   losartan (COZAAR) 100 MG tablet TAKE 1 TABLET(100 MG) BY MOUTH DAILY 90 tablet 3   Multiple Vitamins-Minerals (PRESERVISION AREDS 2 PO) Take 1 tablet by mouth 2 (two) times daily.     spironolactone (ALDACTONE) 25 MG tablet Take 1 tablet (25 mg total) by mouth daily. (Patient taking differently: Take 12.5 mg by mouth daily.) 90 tablet 3   trimethoprim-polymyxin b  (POLYTRIM) ophthalmic solution SMARTSIG:In Eye(s)     UNABLE TO FIND Place into the right eye every 30 (thirty) days. Med Name: Avastin Steroid Eye injection     Current Facility-Administered Medications  Medication Dose Route Frequency Provider Last Rate Last Admin   0.9 %  sodium chloride infusion  500 mL Intravenous Once Mansouraty, Telford Nab., MD        Current Outpatient Medications:    amLODipine (NORVASC) 10 MG tablet, Take 1 tablet (10 mg total) by mouth daily., Disp: 90 tablet, Rfl: 3   CALCIUM CITRATE-VITAMIN D PO, Take 1 tablet by mouth daily., Disp: , Rfl:    Cholecalciferol (VITAMIN D3) 75 MCG (3000 UT) TABS, Take 1 capsule by mouth daily., Disp: , Rfl:    esomeprazole (NEXIUM) 40 MG capsule, Take 1 capsule (40 mg total) by mouth daily., Disp: 30 capsule, Rfl: 6   levothyroxine (SYNTHROID) 50 MCG tablet, Take 1 tablet (50 mcg total) by mouth daily., Disp: 90 tablet, Rfl: 3   losartan (COZAAR) 100 MG tablet, TAKE 1 TABLET(100 MG) BY MOUTH DAILY, Disp: 90 tablet, Rfl: 3   Multiple Vitamins-Minerals (PRESERVISION AREDS 2 PO), Take 1 tablet by mouth 2 (two) times daily., Disp: , Rfl:    spironolactone (ALDACTONE) 25 MG tablet, Take 1 tablet (25 mg total) by mouth daily. (Patient taking differently:  Take 12.5 mg by mouth daily.), Disp: 90 tablet, Rfl: 3   trimethoprim-polymyxin b (POLYTRIM) ophthalmic solution, SMARTSIG:In Eye(s), Disp: , Rfl:    UNABLE TO FIND, Place into the right eye every 30 (thirty) days. Med Name: Avastin Steroid Eye injection, Disp: , Rfl:   Current Facility-Administered Medications:    0.9 %  sodium chloride infusion, 500 mL, Intravenous, Once, Mansouraty, Telford Nab., MD Allergies  Allergen Reactions   Ace Inhibitors Cough   Bactrim [Sulfamethoxazole-Trimethoprim] Other (See Comments)    hyponatremia   Metoprolol Swelling   Family History  Problem Relation Age of Onset   Hypertension Mother    Arthritis Mother    Hearing loss Mother    Colon polyps  Mother    Pneumonia Father    Hypertension Sister    Diabetes Sister    Heart disease Sister    Hyperlipidemia Brother    Diabetes Brother    Hyperlipidemia Brother    Hypertension Brother    Asthma Brother    Other Brother        COVID   Diabetes Maternal Grandfather    Liver disease Daughter        fatty liver   Diabetes Other    Hypertension Other    Obesity Other    Esophageal cancer Neg Hx    Social History   Socioeconomic History   Marital status: Divorced    Spouse name: Not on file   Number of children: 3   Years of education: Not on file   Highest education level: Not on file  Occupational History   Occupation: retired  Tobacco Use   Smoking status: Never   Smokeless tobacco: Never  Vaping Use   Vaping Use: Never used  Substance and Sexual Activity   Alcohol use: No   Drug use: No   Sexual activity: Not on file  Other Topics Concern   Not on file  Social History Narrative   Born: [place], [place]   Occupation: worked in Furniture conservator/restorer, Clinical cytogeneticist, and Automotive engineer, 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   Divorced   3 children, 1 passed away at age 64 (Engineering geologist), 2 daughters alive   Social Determinants of Health   Financial Resource Strain: [place]  (08/03/2021)   Overall Financial Resource Strain (CARDIA)    Difficulty of Paying Living Expenses: Not hard at all  Food Insecurity: No [place] (08/03/2021)   Hunger Vital Sign    Worried About Running Out of Food in the Last Year: Never true    [place] in the Last Year: Never true  Transportation Needs: No Transportation Needs (08/03/2021)   PRAPARE - Hydrologist (Medical): No    Lack of Transportation (Non-Medical): No  Physical Activity: Sufficiently Active (08/03/2021)   Exercise Vital Sign    Days of Exercise per Week: 5 days    Minutes of Exercise per Session: 30 min  Stress: No Stress Concern Present (08/03/2021)   [place]    Feeling of Stress : Not at all  Social Connections: Moderately Isolated (08/03/2021)   Social Connection and Isolation Panel [NHANES]    Frequency of Communication with Friends and Family: More than three times a week    Frequency of Social Gatherings with Friends and Family: More than three times a week    Attends Religious Services: More than 4 times per year    Active Member of Clubs or  Organizations: No    Attends Archivist Meetings: Never    Marital Status: Divorced  Human resources officer Violence: Not At Risk (08/03/2021)   Humiliation, Afraid, Rape, and Kick questionnaire    Fear of Current or Ex-Partner: No    Emotionally Abused: No    Physically Abused: No    Sexually Abused: No    Physical Exam: Today's Vitals   09/22/21 1246  BP: (!) 148/68  Pulse: 72  Temp: (!) 97.3 F (36.3 C)  TempSrc: Temporal  SpO2: 99%  Weight: 166 lb (75.3 kg)  Height: '4\' 10"'$  (1.473 m)   Body mass index is 34.69 kg/m. GEN: NAD EYE: Sclerae anicteric ENT: MMM CV: Non-tachycardic GI: Soft, NT/ND NEURO:  Alert & Oriented x 3  Lab Results: No results for input(s): "WBC", "HGB", "HCT", "PLT" in the last 72 hours. BMET No results for input(s): "NA", "K", "CL", "CO2", "GLUCOSE", "BUN", "CREATININE", "CALCIUM" in the last 72 hours. LFT No results for input(s): "PROT", "ALBUMIN", "AST", "ALT", "ALKPHOS", "BILITOT", "BILIDIR", "IBILI" in the last 72 hours. PT/INR No results for input(s): "LABPROT", "INR" in the last 72 hours.   Impression / Plan: This is a 76 y.o.female who presents for EGD for follow-up of previous gastric ulcer and previous history of GIM (last full endoscopy this year in 2/23 showed no further evidence of intestinal metaplasia on gastric mapping).  The risks and benefits of endoscopic evaluation/treatment were discussed with the patient and/or family; these include but are not limited to the risk of perforation, infection, bleeding, missed  lesions, lack of diagnosis, severe illness requiring hospitalization, as well as anesthesia and sedation related illnesses.  The patient's history has been reviewed, patient examined, no change in status, and deemed stable for procedure.  The patient and/or family is agreeable to proceed.    Justice Britain, MD [facility] Office # [phone]

## 2021-09-22 NOTE — Progress Notes (Signed)
1253 Robinul 0.1 mg IV given due large amount of secretions upon assessment.  MD made aware, vss

## 2021-09-22 NOTE — Progress Notes (Signed)
Report given to PACU, vss 

## 2021-09-25 ENCOUNTER — Telehealth: Payer: Self-pay

## 2021-09-25 ENCOUNTER — Encounter (INDEPENDENT_AMBULATORY_CARE_PROVIDER_SITE_OTHER): Payer: Medicare Other | Admitting: Ophthalmology

## 2021-09-25 DIAGNOSIS — H35033 Hypertensive retinopathy, bilateral: Secondary | ICD-10-CM | POA: Diagnosis not present

## 2021-09-25 DIAGNOSIS — H43813 Vitreous degeneration, bilateral: Secondary | ICD-10-CM | POA: Diagnosis not present

## 2021-09-25 DIAGNOSIS — I10 Essential (primary) hypertension: Secondary | ICD-10-CM

## 2021-09-25 DIAGNOSIS — D3132 Benign neoplasm of left choroid: Secondary | ICD-10-CM

## 2021-09-25 DIAGNOSIS — H353231 Exudative age-related macular degeneration, bilateral, with active choroidal neovascularization: Secondary | ICD-10-CM | POA: Diagnosis not present

## 2021-09-25 NOTE — Telephone Encounter (Signed)
  Follow up Call-     09/22/2021   12:46 PM 05/26/2021   10:28 AM  Call back number  Post procedure Call Back phone  # [phone]  Permission to leave phone message Yes Yes     Patient questions:  Do you have a fever, pain , or abdominal swelling? No. Pain Score  0 *  Have you tolerated food without any problems? Yes.    Have you been able to return to your normal activities? Yes.    Do you have any questions about your discharge instructions: Diet   No. Medications  No. Follow up visit  No.  Do you have questions or concerns about your Care? No.  Actions: * If pain score is 4 or above: No action needed, pain <4.

## 2021-09-28 ENCOUNTER — Telehealth: Payer: Self-pay | Admitting: Gastroenterology

## 2021-09-28 MED ORDER — ESOMEPRAZOLE MAGNESIUM 20 MG PO CPDR: 20.0000 mg | DELAYED_RELEASE_CAPSULE | Freq: Every day | ORAL | 2 refills | Status: DC

## 2021-09-28 NOTE — Telephone Encounter (Signed)
Patient called regarding the prescription Dr. Jerilynn Mages called in for her following her recent procedure.  He wanted her to decrease her dose to 20 mg daily but only after she completed the 40 mg she already has.  When she called the pharmacy, they told her that the 20 mg that had been called in was in powder form.  She is asking if that could be changed to capsule.  Please call and advise.  Thank you.

## 2021-09-28 NOTE — Telephone Encounter (Signed)
Prescription has been changed to Nexium Capsule and new rx has been sent to pharmacy.

## 2021-10-06 ENCOUNTER — Encounter: Payer: Medicare Other | Admitting: Gastroenterology

## 2021-11-20 ENCOUNTER — Encounter (INDEPENDENT_AMBULATORY_CARE_PROVIDER_SITE_OTHER): Payer: Medicare Other | Admitting: Ophthalmology

## 2021-11-20 DIAGNOSIS — I10 Essential (primary) hypertension: Secondary | ICD-10-CM

## 2021-11-20 DIAGNOSIS — D3132 Benign neoplasm of left choroid: Secondary | ICD-10-CM | POA: Diagnosis not present

## 2021-11-20 DIAGNOSIS — H35033 Hypertensive retinopathy, bilateral: Secondary | ICD-10-CM

## 2021-11-20 DIAGNOSIS — H353231 Exudative age-related macular degeneration, bilateral, with active choroidal neovascularization: Secondary | ICD-10-CM | POA: Diagnosis not present

## 2021-11-20 DIAGNOSIS — H43813 Vitreous degeneration, bilateral: Secondary | ICD-10-CM

## 2021-12-03 ENCOUNTER — Other Ambulatory Visit: Payer: Self-pay | Admitting: Internal Medicine

## 2021-12-09 ENCOUNTER — Other Ambulatory Visit: Payer: Self-pay | Admitting: Internal Medicine

## 2021-12-13 ENCOUNTER — Encounter: Payer: Self-pay | Admitting: Internal Medicine

## 2021-12-13 ENCOUNTER — Ambulatory Visit (INDEPENDENT_AMBULATORY_CARE_PROVIDER_SITE_OTHER): Payer: Medicare Other | Admitting: Internal Medicine

## 2021-12-13 VITALS — BP 138/82 | HR 78 | Ht 59.0 in | Wt 161.0 lb

## 2021-12-13 DIAGNOSIS — R7303 Prediabetes: Secondary | ICD-10-CM

## 2021-12-13 DIAGNOSIS — M810 Age-related osteoporosis without current pathological fracture: Secondary | ICD-10-CM

## 2021-12-13 DIAGNOSIS — I10 Essential (primary) hypertension: Secondary | ICD-10-CM | POA: Diagnosis not present

## 2021-12-13 LAB — LIPID PANEL
Cholesterol: 149 mg/dL (ref 0–200)
HDL: 86.2 mg/dL (ref >39.00)
LDL Cholesterol: 57 mg/dL (ref 0–99)
NonHDL: 63.1
Total CHOL/HDL Ratio: 2
Triglycerides: 31 mg/dL (ref 0.0–149.0)
VLDL: 6.2 mg/dL (ref 0.0–40.0)

## 2021-12-13 LAB — COMPREHENSIVE METABOLIC PANEL
ALT: 10 U/L (ref 0–35)
AST: 16 U/L (ref 0–37)
Albumin: 4 g/dL (ref 3.5–5.2)
Alkaline Phosphatase: 101 U/L (ref 39–117)
BUN: 26 mg/dL — ABNORMAL HIGH (ref 6–23)
CO2: 27 mEq/L (ref 19–32)
Calcium: 9.5 mg/dL (ref 8.4–10.5)
Chloride: 105 mEq/L (ref 96–112)
Creatinine, Ser: 1.08 mg/dL (ref 0.40–1.20)
GFR: 50.26 mL/min — ABNORMAL LOW (ref >60.00)
Glucose, Bld: 87 mg/dL (ref 70–99)
Potassium: 4.4 mEq/L (ref 3.5–5.1)
Sodium: 139 mEq/L (ref 135–145)
Total Bilirubin: 0.5 mg/dL (ref 0.2–1.2)
Total Protein: 7.5 g/dL (ref 6.0–8.3)

## 2021-12-13 LAB — CBC
HCT: 36.2 % (ref 36.0–46.0)
Hemoglobin: 12 g/dL (ref 12.0–15.0)
MCHC: 33.1 g/dL (ref 30.0–36.0)
MCV: 94.7 fl (ref 78.0–100.0)
Platelets: 305 10*3/uL (ref 150.0–400.0)
RBC: 3.83 Mil/uL — ABNORMAL LOW (ref 3.87–5.11)
RDW: 14 % (ref 11.5–15.5)
WBC: 5.7 10*3/uL (ref 4.0–10.5)

## 2021-12-13 LAB — MAGNESIUM: Magnesium: 1.9 mg/dL (ref 1.5–2.5)

## 2021-12-13 LAB — HEMOGLOBIN A1C: Hgb A1c MFr Bld: 6.1 % (ref 4.6–6.5)

## 2021-12-13 NOTE — Patient Instructions (Signed)
We can have you cut the amlodipine in half to 5 mg and let us know in 1-2 months how the blood pressure is going.

## 2021-12-13 NOTE — Progress Notes (Signed)
   Subjective:   Patient ID: Alexandra Cruz, female    DOB: 1945/09/25, 76 y.o.   MRN: [id number]  HPI The patient is a 76 YO female coming in for follow up BP. Stopped spironolactone on her own about 1-2 months ago.   Review of Systems  Constitutional: Negative.   HENT: Negative.    Eyes: Negative.   Respiratory:  Negative for cough, chest tightness and shortness of breath.   Cardiovascular:  Negative for chest pain, palpitations and leg swelling.  Gastrointestinal:  Negative for abdominal distention, abdominal pain, constipation, diarrhea, nausea and vomiting.  Musculoskeletal: Negative.   Skin: Negative.   Neurological: Negative.   Psychiatric/Behavioral: Negative.      Objective:  Physical Exam Constitutional:      Appearance: She is well-developed.  HENT:     Head: Normocephalic and atraumatic.  Cardiovascular:     Rate and Rhythm: Normal rate and regular rhythm.  Pulmonary:     Effort: Pulmonary effort is normal. No respiratory distress.     Breath sounds: Normal breath sounds. No wheezing or rales.  Abdominal:     General: Bowel sounds are normal. There is no distension.     Palpations: Abdomen is soft.     Tenderness: There is no abdominal tenderness. There is no rebound.  Musculoskeletal:     Cervical back: Normal range of motion.     Right lower leg: Edema present.     Left lower leg: Edema present.  Skin:    General: Skin is warm and dry.  Neurological:     Mental Status: She is alert and oriented to person, place, and time.     Coordination: Coordination normal.     Vitals:   12/13/21 1035 12/13/21 1045 12/13/21 1102  BP: (!) 172/92 (!) 168/84 138/82  Pulse: 78    SpO2: 98%    Weight: 161 lb (73 kg)    Height: '4\' 11"'$  (1.499 m)      Assessment & Plan:

## 2021-12-13 NOTE — Assessment & Plan Note (Addendum)
Getting DEXA mid-October 2023. We discussed medications and she agrees to reclast if still osteoporosis. Checking CMP to ensure able to proceed with treatment if needed.

## 2021-12-14 NOTE — Assessment & Plan Note (Signed)
Checking HgA1c and adjust as needed. We did discuss this and diet/lifestyle change to help.

## 2021-12-14 NOTE — Assessment & Plan Note (Signed)
BP at goal on recheck and home readings are controlled as well. She would like to reduce amlodipine to 5 mg daily which we will do. Continue losartan 100 mg daily. It is not surprising she is having more leg swelling since stopping spironolactone as this is a diuretic. Checking CBC and CMP.

## 2021-12-18 ENCOUNTER — Telehealth: Payer: Self-pay | Admitting: Gastroenterology

## 2021-12-18 NOTE — Telephone Encounter (Signed)
The pt is calling with complaints of vaginal itching with some rectal itching. She also complains of frequent urination. I have asked her to call her PCP to discuss those complaints.  She has been set up for an office visit to discuss if she should continue PPI.  She will keep that appt.

## 2021-12-18 NOTE — Telephone Encounter (Signed)
Patient called and states she has an itch when she uses the bathroom. She thinks it could be from one of her rx. Patient is requesting a call back to further advise. Went ahead and made her appt for November.

## 2022-01-01 ENCOUNTER — Encounter (INDEPENDENT_AMBULATORY_CARE_PROVIDER_SITE_OTHER): Payer: Medicare Other | Admitting: Ophthalmology

## 2022-01-01 ENCOUNTER — Other Ambulatory Visit: Payer: Self-pay | Admitting: Gastroenterology

## 2022-01-01 DIAGNOSIS — H35033 Hypertensive retinopathy, bilateral: Secondary | ICD-10-CM | POA: Diagnosis not present

## 2022-01-01 DIAGNOSIS — H353122 Nonexudative age-related macular degeneration, left eye, intermediate dry stage: Secondary | ICD-10-CM

## 2022-01-01 DIAGNOSIS — H353211 Exudative age-related macular degeneration, right eye, with active choroidal neovascularization: Secondary | ICD-10-CM | POA: Diagnosis not present

## 2022-01-01 DIAGNOSIS — H43813 Vitreous degeneration, bilateral: Secondary | ICD-10-CM

## 2022-01-01 DIAGNOSIS — I10 Essential (primary) hypertension: Secondary | ICD-10-CM | POA: Diagnosis not present

## 2022-02-02 ENCOUNTER — Ambulatory Visit
Admission: RE | Admit: 2022-02-02 | Discharge: 2022-02-02 | Disposition: A | Discharge status: Home / Self Care | Payer: Medicare Other | Source: Ambulatory Visit | Attending: Internal Medicine | Admitting: Internal Medicine

## 2022-02-02 DIAGNOSIS — Z1231 Encounter for screening mammogram for malignant neoplasm of breast: Secondary | ICD-10-CM

## 2022-02-02 DIAGNOSIS — M810 Age-related osteoporosis without current pathological fracture: Secondary | ICD-10-CM

## 2022-02-02 DIAGNOSIS — Z1239 Encounter for other screening for malignant neoplasm of breast: Secondary | ICD-10-CM

## 2022-02-02 DIAGNOSIS — Z13820 Encounter for screening for osteoporosis: Secondary | ICD-10-CM

## 2022-02-12 ENCOUNTER — Encounter (INDEPENDENT_AMBULATORY_CARE_PROVIDER_SITE_OTHER): Payer: Medicare Other | Admitting: Ophthalmology

## 2022-02-12 DIAGNOSIS — H353211 Exudative age-related macular degeneration, right eye, with active choroidal neovascularization: Secondary | ICD-10-CM

## 2022-02-12 DIAGNOSIS — I10 Essential (primary) hypertension: Secondary | ICD-10-CM

## 2022-02-12 DIAGNOSIS — H35033 Hypertensive retinopathy, bilateral: Secondary | ICD-10-CM | POA: Diagnosis not present

## 2022-02-12 DIAGNOSIS — D3132 Benign neoplasm of left choroid: Secondary | ICD-10-CM

## 2022-02-12 DIAGNOSIS — H43813 Vitreous degeneration, bilateral: Secondary | ICD-10-CM

## 2022-02-12 DIAGNOSIS — H353122 Nonexudative age-related macular degeneration, left eye, intermediate dry stage: Secondary | ICD-10-CM | POA: Diagnosis not present

## 2022-02-13 ENCOUNTER — Ambulatory Visit (INDEPENDENT_AMBULATORY_CARE_PROVIDER_SITE_OTHER): Payer: Medicare Other | Admitting: Gastroenterology

## 2022-02-13 ENCOUNTER — Encounter: Payer: Self-pay | Admitting: Gastroenterology

## 2022-02-13 VITALS — BP 140/76 | HR 100 | Ht <= 58 in | Wt 158.0 lb

## 2022-02-13 DIAGNOSIS — K602 Anal fissure, unspecified: Secondary | ICD-10-CM

## 2022-02-13 DIAGNOSIS — K649 Unspecified hemorrhoids: Secondary | ICD-10-CM | POA: Diagnosis not present

## 2022-02-13 DIAGNOSIS — K625 Hemorrhage of anus and rectum: Secondary | ICD-10-CM

## 2022-02-13 DIAGNOSIS — K31A0 Gastric intestinal metaplasia, unspecified: Secondary | ICD-10-CM

## 2022-02-13 DIAGNOSIS — K5900 Constipation, unspecified: Secondary | ICD-10-CM

## 2022-02-13 DIAGNOSIS — K449 Diaphragmatic hernia without obstruction or gangrene: Secondary | ICD-10-CM

## 2022-02-13 DIAGNOSIS — K219 Gastro-esophageal reflux disease without esophagitis: Secondary | ICD-10-CM

## 2022-02-13 NOTE — Progress Notes (Signed)
[facility]ISIT   Primary Care Provider Hoyt Koch, MD [address] [place] [address] [phone]  Patient Profile: Alexandra Cruz is a 76 y.o. female with a pmh significant for hypertension, hypothyroidism, osteoporosis, glaucoma, history cystocele and vaginocele repairs, status postcholecystectomy, GERD, hiatal hernia, PUD (manifested as prior GU), colon polyps (benign on last outside report).  The patient presents to the [facility] for an evaluation and management of problem(s) noted below:  Problem List 1. Constipation, unspecified constipation type   2. Hemorrhoids, unspecified hemorrhoid type   3. BRBPR (bright red blood per rectum)   4. Anal fissure   5. Gastroesophageal reflux disease without esophagitis   6. Hiatal hernia   7. Gastric intestinal metaplasia     History of Present Illness Please see prior notes for full details of HPI.  Interval History The patient returns for follow-up.  I last saw the patient in the summer when she came in for her follow-up.  She has continued to have issues of constipation.  She recently had to perform a manual disimpaction.  MiraLAX on a daily basis had been helpful but caused her to have more pasty stools.  Then when she tried to restart it was not successful.  She has noted some minor bright red blood per rectum as result of this.  She was concerned as to whether an anal fissure or tear could be a reason for symptoms versus hemorrhoids.  She is not experiencing significant or progressive GERD symptoms at this time.  She denies any dysphagia or odynophagia.  GI Review of Systems Positive as above Negative for abdominal pain, nausea, vomiting, melena  Review of Systems General: Denies fevers/chills/weight loss unintentionally Cardiovascular: Denies chest pain Pulmonary: Denies shortness of breath Gastroenterological: See HPI Genitourinary: Denies darkened  urine Hematological: Denies easy bruising/bleeding Endocrine: Denies temperature intolerance Dermatological: Denies jaundice Psychological: Mood is stable   Medications Current Outpatient Medications  Medication Sig Dispense Refill   amLODipine (NORVASC) 10 MG tablet TAKE 1 TABLET BY MOUTH DAILY 90 tablet 3   CALCIUM CITRATE-VITAMIN D PO Take 1 tablet by mouth daily.     Cholecalciferol (VITAMIN D3) 75 MCG (3000 UT) TABS Take 1 capsule by mouth daily.     esomeprazole (NEXIUM) 20 MG capsule TAKE 1 CAPSULE(20 MG) BY MOUTH DAILY 30 capsule 2   levothyroxine (SYNTHROID) 50 MCG tablet Take 1 tablet (50 mcg total) by mouth daily before breakfast. Annual appt due in Jan w/labs must see provider for future refills 90 tablet 1   losartan (COZAAR) 100 MG tablet TAKE 1 TABLET(100 MG) BY MOUTH DAILY 90 tablet 3   Multiple Vitamins-Minerals (PRESERVISION AREDS 2 PO) Take 1 tablet by mouth 2 (two) times daily.     trimethoprim-polymyxin b (POLYTRIM) ophthalmic solution      UNABLE TO FIND Place into the right eye every 30 (thirty) days. Med Name: Avastin Steroid Eye injection     No current facility-administered medications for this visit.    Allergies Allergies  Allergen Reactions   Ace Inhibitors Cough   Bactrim [Sulfamethoxazole-Trimethoprim] Other (See Comments)    hyponatremia   Metoprolol Swelling    Histories Past Medical History:  Diagnosis Date   Adrenal adenoma, right    Anal fissure    Anemia    Anxiety    Colon polyps    Gallstones    GERD (gastroesophageal reflux disease)    Glaucoma    Hypertension    Osteopenia  Osteoporosis    Pneumonia    Thyroid disease    Past Surgical History:  Procedure Laterality Date   ABDOMINAL HYSTERECTOMY  04/09/1977   BLADDER NECK RECONSTRUCTION     BLADDER SUSPENSION     x2   CATARACT EXTRACTION, BILATERAL Bilateral    CHOLECYSTECTOMY  04/09/1996   INGUINAL HERNIA REPAIR Right 2003, 2007   LAPAROSCOPIC INTERNAL HERNIA REPAIR      spiglian hernia   UPPER GASTROINTESTINAL ENDOSCOPY  05/26/2021   Dr.Mansouraty   VAGINAL PROLAPSE REPAIR  04/10/1987   Social History   Socioeconomic History   Marital status: Divorced    Spouse name: Not on file   Number of children: 3   Years of education: Not on file   Highest education level: Not on file  Occupational History   Occupation: retired  Tobacco Use   Smoking status: Never   Smokeless tobacco: Never  Vaping Use   Vaping Use: Never used  Substance and Sexual Activity   Alcohol use: No   Drug use: No   Sexual activity: Not on file  Other Topics Concern   Not on file  Social History Narrative   Born: [place], [place]   Occupation: worked in Furniture conservator/restorer, Clinical cytogeneticist, and Automotive engineer, 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   Divorced   3 children, 1 passed away at age 21 (Engineering geologist), 2 daughters alive   Social Determinants of Health   Financial Resource Strain: [place]  (08/03/2021)   Overall Financial Resource Strain (CARDIA)    Difficulty of Paying Living Expenses: Not hard at all  Food Insecurity: No [place] (08/03/2021)   Hunger Vital Sign    Worried About Running Out of Food in the Last Year: Never true    [place] in the Last Year: Never true  Transportation Needs: No Transportation Needs (08/03/2021)   PRAPARE - Hydrologist (Medical): No    Lack of Transportation (Non-Medical): No  Physical Activity: Sufficiently Active (08/03/2021)   Exercise Vital Sign    Days of Exercise per Week: 5 days    Minutes of Exercise per Session: 30 min  Stress: No Stress Concern Present (08/03/2021)   [place]    Feeling of Stress : Not at all  Social Connections: Moderately Isolated (08/03/2021)   Social Connection and Isolation Panel [NHANES]    Frequency of Communication with Friends and Family: More than three times a week    Frequency of Social Gatherings with Friends  and Family: More than three times a week    Attends Religious Services: More than 4 times per year    Active Member of [employer] or Organizations: No    Attends Archivist Meetings: Never    Marital Status: Divorced  Human resources officer Violence: Not At Risk (08/03/2021)   Humiliation, Afraid, Rape, and Kick questionnaire    Fear of Current or Ex-Partner: No    Emotionally Abused: No    Physically Abused: No    Sexually Abused: No   Family History  Problem Relation Age of Onset   Hypertension Mother    Arthritis Mother    Hearing loss Mother    Colon polyps Mother    Pneumonia Father    Hypertension Sister    Diabetes Sister    Heart disease Sister    Hyperlipidemia Brother    Diabetes Brother    Hyperlipidemia Brother    Hypertension Brother  Asthma Brother    Other Brother        COVID   Diabetes Maternal Grandfather    Liver disease Daughter        fatty liver   Diabetes Other    Hypertension Other    Obesity Other    Esophageal cancer Neg Hx    Colon cancer Neg Hx    Inflammatory bowel disease Neg Hx    Pancreatic cancer Neg Hx    Rectal cancer Neg Hx    Stomach cancer Neg Hx    I have reviewed her medical, social, and family history in detail and updated the electronic medical record as necessary.    PHYSICAL EXAMINATION  BP (!) 140/76   Pulse 100   Ht '4\' 10"'$  (1.473 m)   Wt 158 lb (71.7 kg)   SpO2 98%   BMI 33.02 kg/m  Wt Readings from Last 3 Encounters:  02/13/22 158 lb (71.7 kg)  12/13/21 161 lb (73 kg)  09/22/21 166 lb (75.3 kg)  GEN: NAD, appears stated age, doesn't appear chronically ill PSYCH: Cooperative, without pressured speech EYE: Conjunctivae pink, sclerae anicteric ENT: MMM CV: Nontachycardic RESP: No audible wheezing GI: NABS, soft, protuberant abdomen, nontender, without rebound  GU: DRE shows evidence of likely posterior anal fissure and hemorrhoids (internal) no palpable masses and brown stool was noted in the vault, decreased  sphincter tone noted slightly MSK/EXT: No significant lower extremity edema SKIN: No jaundice NEURO:  Alert & Oriented x 3, no focal deficits   REVIEW OF DATA  I reviewed the following data at the time of this encounter:  GI Procedures and Studies  June 2023 EGD - No gross lesions in esophagus. - Z-line irregular, 35 cm from the incisors. - 5 cm hiatal hernia. - Normal mucosa was found in the entire stomach. - No gross lesions in the duodenal bulb, in the first portion of the duodenum and in the second portion of the duodenum.  Laboratory Studies  Reviewed those in epic  Imaging Studies  No relevant studies to review   ASSESSMENT  Ms. Starn is a 76 y.o. female with a pmh significant for hypertension, hypothyroidism, osteoporosis, glaucoma, history cystocele and vaginocele repairs, status postcholecystectomy, GERD, hiatal hernia, PUD (manifested as prior GU), colon polyps (benign on last outside report).  The patient is seen today for evaluation and management of:  1. Constipation, unspecified constipation type   2. Hemorrhoids, unspecified hemorrhoid type   3. BRBPR (bright red blood per rectum)   4. Anal fissure   5. Gastroesophageal reflux disease without esophagitis   6. Hiatal hernia   7. Gastric intestinal metaplasia    The patient is hemodynamically stable.  Clinically she is experiencing increasing issues from a constipation standpoint.  Etiology is likely a result of lifestyle issues though she notes that she does drink a decent amount of fluids per day.  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  I think her most recent episode of rectal bleeding were likely from what appears to be a healing fissure versus internal hemorrhoids.  If she continues to have this episodes of bright red blood per rectum then I would recommend a repeat colonoscopy or flexible sigmoidoscopy just to ensure nothing else is being missed.  We have recommended that she use over-the-counter hemorrhoidal  therapies but could also consider Anusol therapy in the future.  I recommend initiating fiber and trialing laxative therapy on a daily or every other day basis to try and help move  things along.  We are going to give the patient some samples of Trulance (what we have available today) and see if that may be helpful for her in the future as well.  We can explore other avenues of constipation therapy as well.  As the patient had no evidence of significant abnormalities on her stomach lining on her repeat endoscopy and her initial gastric mapping biopsies done earlier this year showed no evidence of GIM, we will discontinue surveillance for her intestinal metaplasia.  We will continue her PPI for GERD symptoms at the lowest effective dose for her.  All patient questions were answered to the best of my ability, and the patient agrees to the aforementioned plan of action with follow-up as indicated.   PLAN  Toileting techniques discussed with the patient Initiate FiberCon daily and then try to increase to twice daily Utilization of laxatives is reasonable and may use Dulcolax on an every other day basis Samples of Trulance 3 mg were given to patient in case she were to continue to have issues from a constipation standpoint at which point we could trial other stronger laxatives if necessary May use OTC hemorrhoidal therapies If recurrent episodes of bright red blood per rectum occur, will recommend flexible sigmoidoscopy versus colonoscopy to ensure nothing else is being missed (last colonoscopy in 2020 at outside provider recommended 10-year follow-up) We will discontinue gastric intestinal metaplasia surveillance (negative biopsies as noted above) Continue PPI 20 mg daily   No orders of the defined types were placed in this encounter.   New Prescriptions   No medications on file   Modified Medications   No medications on file    Planned Follow Up No follow-ups on file.   Total Time in  Face-to-Face and in Coordination of Care for patient including independent/personal interpretation/review of prior testing, medical history, examination, medication adjustment, communicating results with the patient directly, and documentation within the EHR is 25 minutes.   Justice Britain, MD [facility] Office # [phone]

## 2022-02-13 NOTE — Patient Instructions (Signed)
Fiber Con - Take 1 tablet by mouth once daily for 1 week, then consider increasing it to twice daily.   Trial of Dulcolax every other day.   You may also use over the counter Hemorrhoidal Cream , like Preparation H cream to help with hemorrhoids  We have given you samples of the following medication to take:Trulance 3 mg once daily. If Trulance works well for you, let us know and Dr. Rush Landmark will send prescription to the pharmacy.  .   Toileting tips to help with your constipation - Drink at least 64-80 ounces of water/liquid per day. - Establish a time to try to move your bowels every day.  For many people, this is after a cup of coffee or after a meal such as breakfast. - Sit all of the way back on the toilet keeping your back fairly straight and while sitting up, try to rest the tops of your forearms on your upper thighs.   - Raising your feet with a step stool/squatty potty can be helpful to improve the angle that allows your stool to pass through the rectum. - Relax the rectum feeling it bulge toward the toilet water.  If you feel your rectum raising toward your body, you are contracting rather than relaxing. - Breathe in and slowly exhale. "Belly breath" by expanding your belly towards your belly button. Keep belly expanded as you gently direct pressure down and back to the anus.  A low pitched GRRR sound can assist with increasing intra-abdominal pressure.  - Repeat 3-4 times. If unsuccessful, contract the pelvic floor to restore normal tone and get off the toilet.  Avoid excessive straining. - To reduce excessive wiping by teaching your anus to normally contract, place hands on outer aspect of knees and resist knee movement outward.  Hold 5-10 second then place hands just inside of knees and resist inward movement of knees.  Hold 5 seconds.  Repeat a few times each way.  Thank you for choosing me and [facility].  Dr. Rush Landmark

## 2022-02-17 ENCOUNTER — Encounter: Payer: Self-pay | Admitting: Gastroenterology

## 2022-02-17 DIAGNOSIS — K625 Hemorrhage of anus and rectum: Secondary | ICD-10-CM | POA: Insufficient documentation

## 2022-02-17 DIAGNOSIS — K449 Diaphragmatic hernia without obstruction or gangrene: Secondary | ICD-10-CM | POA: Insufficient documentation

## 2022-02-17 DIAGNOSIS — K602 Anal fissure, unspecified: Secondary | ICD-10-CM | POA: Insufficient documentation

## 2022-02-17 DIAGNOSIS — K5900 Constipation, unspecified: Secondary | ICD-10-CM | POA: Insufficient documentation

## 2022-02-17 DIAGNOSIS — K31A0 Gastric intestinal metaplasia, unspecified: Secondary | ICD-10-CM | POA: Insufficient documentation

## 2022-03-26 ENCOUNTER — Encounter (INDEPENDENT_AMBULATORY_CARE_PROVIDER_SITE_OTHER): Payer: Medicare Other | Admitting: Ophthalmology

## 2022-03-26 DIAGNOSIS — H353231 Exudative age-related macular degeneration, bilateral, with active choroidal neovascularization: Secondary | ICD-10-CM | POA: Diagnosis not present

## 2022-03-26 DIAGNOSIS — I10 Essential (primary) hypertension: Secondary | ICD-10-CM | POA: Diagnosis not present

## 2022-03-26 DIAGNOSIS — H43813 Vitreous degeneration, bilateral: Secondary | ICD-10-CM

## 2022-03-26 DIAGNOSIS — D3132 Benign neoplasm of left choroid: Secondary | ICD-10-CM | POA: Diagnosis not present

## 2022-03-26 DIAGNOSIS — H35033 Hypertensive retinopathy, bilateral: Secondary | ICD-10-CM | POA: Diagnosis not present

## 2022-05-07 ENCOUNTER — Encounter (INDEPENDENT_AMBULATORY_CARE_PROVIDER_SITE_OTHER): Payer: Medicare HMO | Admitting: Ophthalmology

## 2022-05-07 DIAGNOSIS — H353231 Exudative age-related macular degeneration, bilateral, with active choroidal neovascularization: Secondary | ICD-10-CM | POA: Diagnosis not present

## 2022-05-07 DIAGNOSIS — I10 Essential (primary) hypertension: Secondary | ICD-10-CM

## 2022-05-07 DIAGNOSIS — H43813 Vitreous degeneration, bilateral: Secondary | ICD-10-CM | POA: Diagnosis not present

## 2022-05-07 DIAGNOSIS — H35033 Hypertensive retinopathy, bilateral: Secondary | ICD-10-CM

## 2022-05-17 ENCOUNTER — Ambulatory Visit: Payer: Medicare Other | Admitting: Internal Medicine

## 2022-05-31 ENCOUNTER — Ambulatory Visit (INDEPENDENT_AMBULATORY_CARE_PROVIDER_SITE_OTHER): Payer: Medicare HMO | Admitting: Internal Medicine

## 2022-05-31 ENCOUNTER — Encounter: Payer: Self-pay | Admitting: Internal Medicine

## 2022-05-31 VITALS — BP 112/68 | HR 72 | Temp 98.2°F | Ht <= 58 in | Wt 150.0 lb

## 2022-05-31 DIAGNOSIS — Z0000 Encounter for general adult medical examination without abnormal findings: Secondary | ICD-10-CM | POA: Diagnosis not present

## 2022-05-31 DIAGNOSIS — I10 Essential (primary) hypertension: Secondary | ICD-10-CM

## 2022-05-31 DIAGNOSIS — R7303 Prediabetes: Secondary | ICD-10-CM | POA: Diagnosis not present

## 2022-05-31 DIAGNOSIS — E039 Hypothyroidism, unspecified: Secondary | ICD-10-CM

## 2022-05-31 LAB — COMPREHENSIVE METABOLIC PANEL
ALT: 10 U/L (ref 0–35)
AST: 15 U/L (ref 0–37)
Albumin: 4.1 g/dL (ref 3.5–5.2)
Alkaline Phosphatase: 82 U/L (ref 39–117)
BUN: 21 mg/dL (ref 6–23)
CO2: 28 mEq/L (ref 19–32)
Calcium: 9.4 mg/dL (ref 8.4–10.5)
Chloride: 103 mEq/L (ref 96–112)
Creatinine, Ser: 0.96 mg/dL (ref 0.40–1.20)
GFR: 57.7 mL/min — ABNORMAL LOW (ref >60.00)
Glucose, Bld: 90 mg/dL (ref 70–99)
Potassium: 4.1 mEq/L (ref 3.5–5.1)
Sodium: 138 mEq/L (ref 135–145)
Total Bilirubin: 0.4 mg/dL (ref 0.2–1.2)
Total Protein: 7 g/dL (ref 6.0–8.3)

## 2022-05-31 LAB — CBC
HCT: 36.9 % (ref 36.0–46.0)
Hemoglobin: 12.3 g/dL (ref 12.0–15.0)
MCHC: 33.4 g/dL (ref 30.0–36.0)
MCV: 95.2 fl (ref 78.0–100.0)
Platelets: 320 10*3/uL (ref 150.0–400.0)
RBC: 3.88 Mil/uL (ref 3.87–5.11)
RDW: 14 % (ref 11.5–15.5)
WBC: 6.1 10*3/uL (ref 4.0–10.5)

## 2022-05-31 LAB — HEMOGLOBIN A1C: Hgb A1c MFr Bld: 6 % (ref 4.6–6.5)

## 2022-05-31 LAB — TSH: TSH: 1.63 u[IU]/mL (ref 0.35–5.50)

## 2022-05-31 MED ORDER — LEVOTHYROXINE SODIUM 50 MCG PO TABS: 50.0000 ug | ORAL_TABLET | Freq: Every day | ORAL | 3 refills | Status: DC

## 2022-05-31 MED ORDER — AMLODIPINE BESYLATE 5 MG PO TABS
5.0000 mg | ORAL_TABLET | Freq: Every day | ORAL | 3 refills | Status: DC
Start: 2022-05-31 — End: 2023-05-31

## 2022-05-31 MED ORDER — LOSARTAN POTASSIUM 100 MG PO TABS: ORAL_TABLET | ORAL | 3 refills | Status: DC

## 2022-05-31 NOTE — Progress Notes (Signed)
   Subjective:   Patient ID: Alexandra Cruz, female    DOB: 1945/12/24, 77 y.o.   MRN: [id number]  HPI The patient is here for physical.  PMH, [facility], social history reviewed and updated  Review of Systems  Constitutional: Negative.   HENT: Negative.    Eyes: Negative.   Respiratory:  Negative for cough, chest tightness and shortness of breath.   Cardiovascular:  Negative for chest pain, palpitations and leg swelling.  Gastrointestinal:  Negative for abdominal distention, abdominal pain, constipation, diarrhea, nausea and vomiting.  Musculoskeletal: Negative.   Skin: Negative.   Neurological: Negative.   Psychiatric/Behavioral: Negative.      Objective:  Physical Exam Constitutional:      Appearance: She is well-developed.  HENT:     Head: Normocephalic and atraumatic.  Cardiovascular:     Rate and Rhythm: Normal rate and regular rhythm.  Pulmonary:     Effort: Pulmonary effort is normal. No respiratory distress.     Breath sounds: Normal breath sounds. No wheezing or rales.  Abdominal:     General: Bowel sounds are normal. There is no distension.     Palpations: Abdomen is soft.     Tenderness: There is no abdominal tenderness. There is no rebound.  Musculoskeletal:     Cervical back: Normal range of motion.  Skin:    General: Skin is warm and dry.  Neurological:     Mental Status: She is alert and oriented to person, place, and time.     Coordination: Coordination normal.     Vitals:   05/31/22 0904 05/31/22 0909 05/31/22 0918  BP: (!) 140/80 (!) 140/80 112/68  Pulse: 72    Temp: 98.2 F (36.8 C)    TempSrc: Oral    SpO2: 98%    Weight: 150 lb (68 kg)    Height: 4' 10"$  (1.473 m)      Assessment & Plan:

## 2022-05-31 NOTE — Patient Instructions (Addendum)
Let us know when you want the hearing checked. I have sent in the 5 mg amlodipine.

## 2022-06-01 ENCOUNTER — Other Ambulatory Visit: Payer: Self-pay | Admitting: Internal Medicine

## 2022-06-01 DIAGNOSIS — Z0000 Encounter for general adult medical examination without abnormal findings: Secondary | ICD-10-CM | POA: Insufficient documentation

## 2022-06-01 NOTE — Assessment & Plan Note (Signed)
Checking TSH and adjust synthroid 50 mcg daily as needed.

## 2022-06-01 NOTE — Assessment & Plan Note (Signed)
Checking CBC and CMP. Adjust as needed. Taking amlodipine 5 mg daily and losartan 100 mg daily and at goal. Home readings consistently at goal. Continue same regimen.

## 2022-06-01 NOTE — Assessment & Plan Note (Signed)
Checking HgA1c and adjust as needed.  

## 2022-06-01 NOTE — Assessment & Plan Note (Signed)
Flu shot declines. Pneumonia complete. Shingrix declines. Tetanus declines. Colonoscopy declines. Mammogram declines, pap smear aged out and dexa due 2025. Counseled about sun safety and mole surveillance. Counseled about the dangers of distracted driving. Given 10 year screening recommendations.

## 2022-06-25 ENCOUNTER — Encounter (INDEPENDENT_AMBULATORY_CARE_PROVIDER_SITE_OTHER): Payer: Medicare HMO | Admitting: Ophthalmology

## 2022-06-25 DIAGNOSIS — D3132 Benign neoplasm of left choroid: Secondary | ICD-10-CM | POA: Diagnosis not present

## 2022-06-25 DIAGNOSIS — I10 Essential (primary) hypertension: Secondary | ICD-10-CM

## 2022-06-25 DIAGNOSIS — H35033 Hypertensive retinopathy, bilateral: Secondary | ICD-10-CM

## 2022-06-25 DIAGNOSIS — H43813 Vitreous degeneration, bilateral: Secondary | ICD-10-CM

## 2022-06-25 DIAGNOSIS — H353231 Exudative age-related macular degeneration, bilateral, with active choroidal neovascularization: Secondary | ICD-10-CM | POA: Diagnosis not present

## 2022-07-26 ENCOUNTER — Telehealth: Payer: Self-pay

## 2022-07-26 NOTE — Telephone Encounter (Signed)
Pt is asking for a referral be sent in for her to get hearing aids with an audiologist.

## 2022-08-13 ENCOUNTER — Encounter (INDEPENDENT_AMBULATORY_CARE_PROVIDER_SITE_OTHER): Payer: Medicare HMO | Admitting: Ophthalmology

## 2022-08-13 DIAGNOSIS — D3132 Benign neoplasm of left choroid: Secondary | ICD-10-CM

## 2022-08-13 DIAGNOSIS — H43813 Vitreous degeneration, bilateral: Secondary | ICD-10-CM | POA: Diagnosis not present

## 2022-08-13 DIAGNOSIS — I10 Essential (primary) hypertension: Secondary | ICD-10-CM | POA: Diagnosis not present

## 2022-08-13 DIAGNOSIS — H35033 Hypertensive retinopathy, bilateral: Secondary | ICD-10-CM

## 2022-08-13 DIAGNOSIS — H353231 Exudative age-related macular degeneration, bilateral, with active choroidal neovascularization: Secondary | ICD-10-CM | POA: Diagnosis not present

## 2022-10-01 ENCOUNTER — Encounter (INDEPENDENT_AMBULATORY_CARE_PROVIDER_SITE_OTHER): Payer: Medicare HMO | Admitting: Ophthalmology

## 2022-10-01 DIAGNOSIS — H353211 Exudative age-related macular degeneration, right eye, with active choroidal neovascularization: Secondary | ICD-10-CM

## 2022-10-01 DIAGNOSIS — H353122 Nonexudative age-related macular degeneration, left eye, intermediate dry stage: Secondary | ICD-10-CM | POA: Diagnosis not present

## 2022-10-01 DIAGNOSIS — H43813 Vitreous degeneration, bilateral: Secondary | ICD-10-CM

## 2022-10-01 DIAGNOSIS — H35033 Hypertensive retinopathy, bilateral: Secondary | ICD-10-CM | POA: Diagnosis not present

## 2022-10-01 DIAGNOSIS — I10 Essential (primary) hypertension: Secondary | ICD-10-CM

## 2022-10-01 DIAGNOSIS — D3132 Benign neoplasm of left choroid: Secondary | ICD-10-CM | POA: Diagnosis not present

## 2022-10-18 IMAGING — MG MM DIGITAL SCREENING BILAT W/ TOMO AND CAD
8 series · 8 of 24 positions shown · non-contrast
Comparison: Previous exam(s).

CLINICAL DATA: Screening.

EXAM:
DIGITAL SCREENING BILATERAL MAMMOGRAM WITH TOMOSYNTHESIS AND CAD
TECHNIQUE: Bilateral screening digital craniocaudal and mediolateral oblique
mammograms were obtained. Bilateral screening digital breast
tomosynthesis was performed. The images were evaluated with
computer-aided detection.

[R MLO synth-2D]
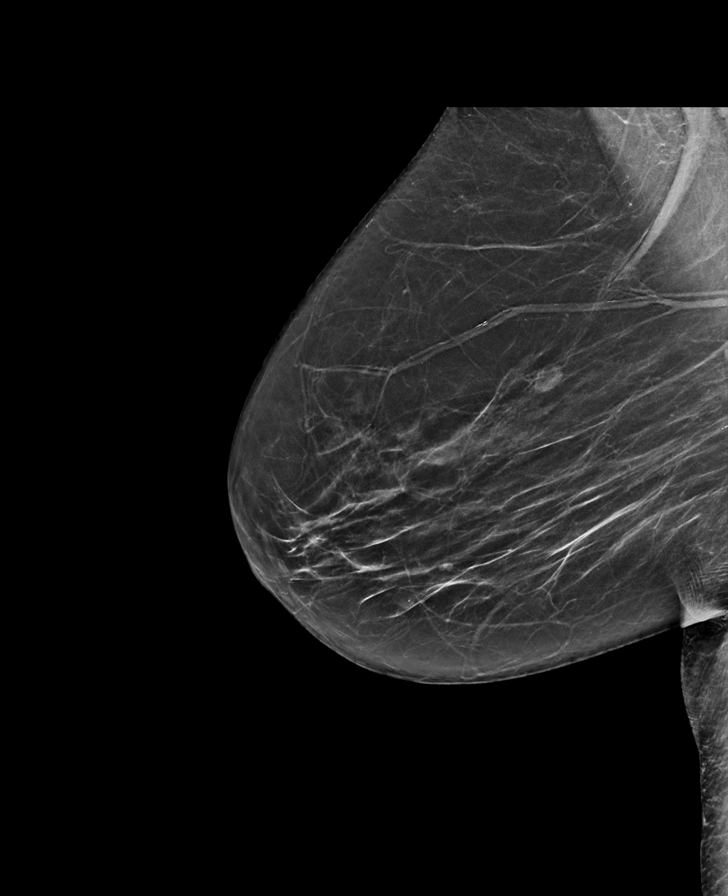

[L CC synth-2D]
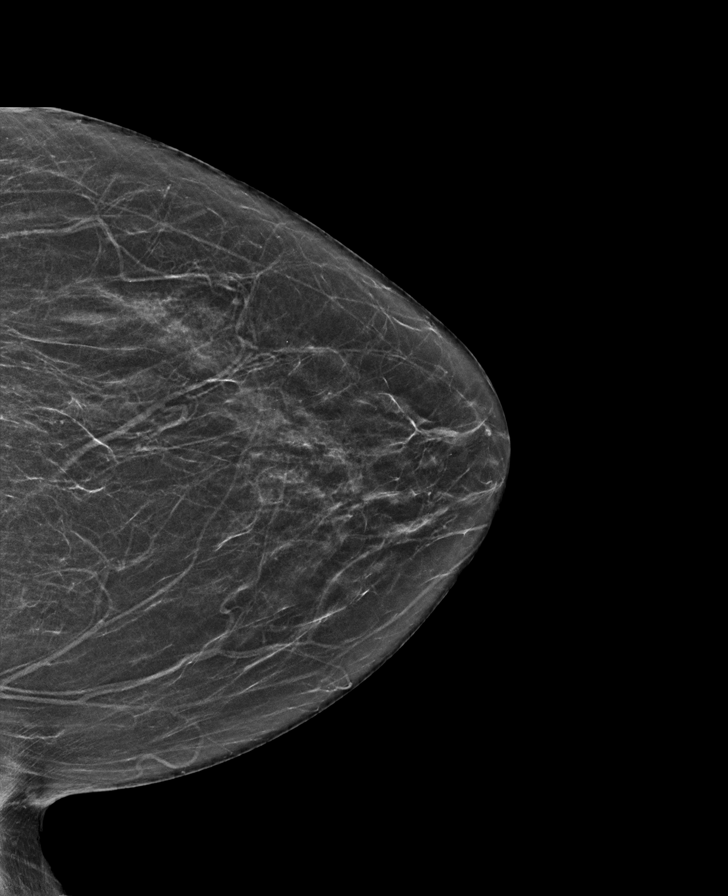

[R CC synth-2D]
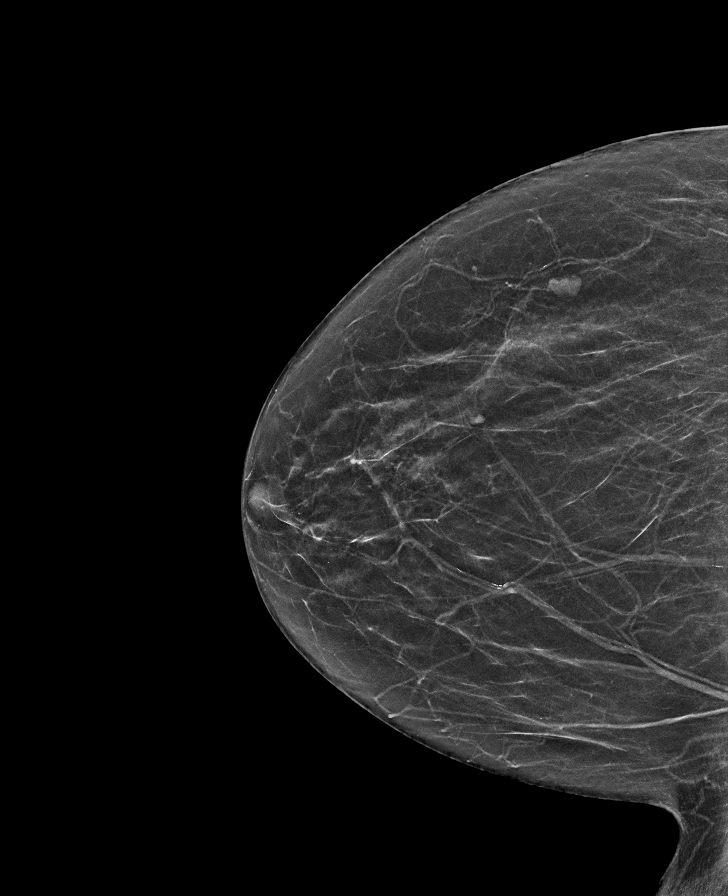

[L MLO synth-2D]
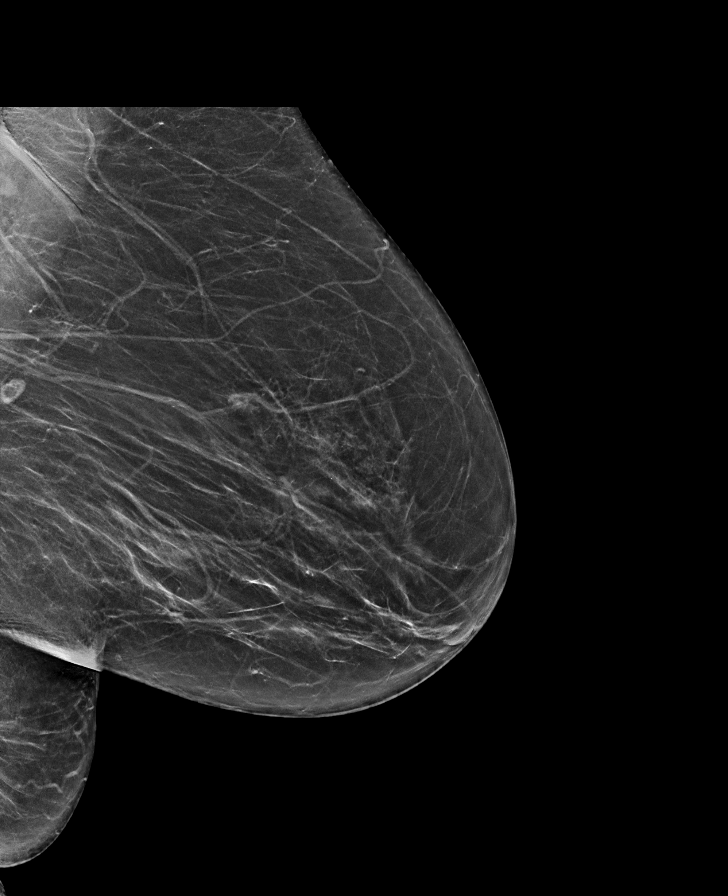

[L MLO tomo · tomo slice 31/60.0]
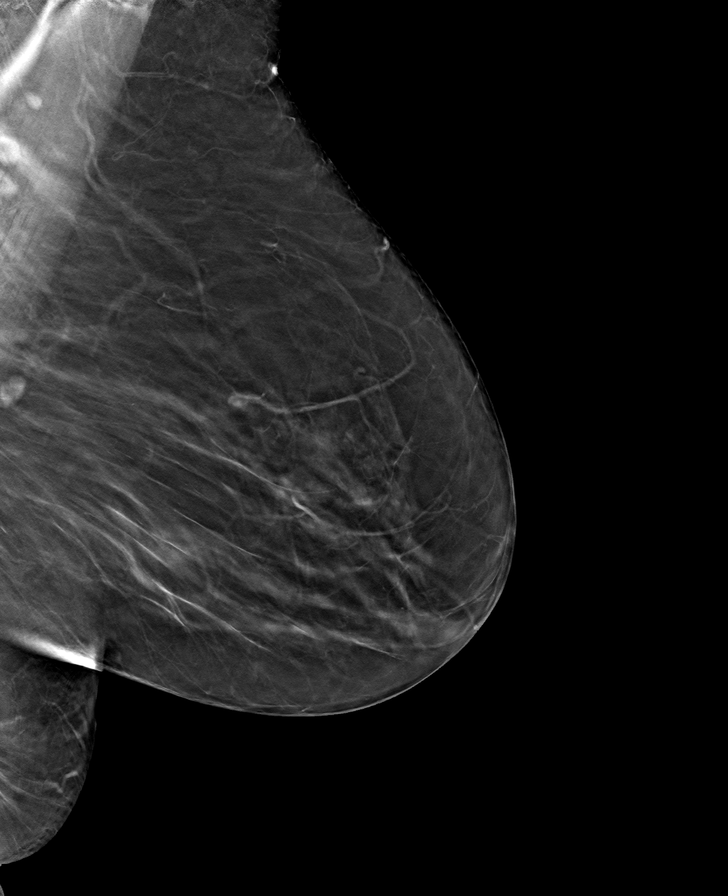

[R CC tomo · tomo slice 27/52.0]
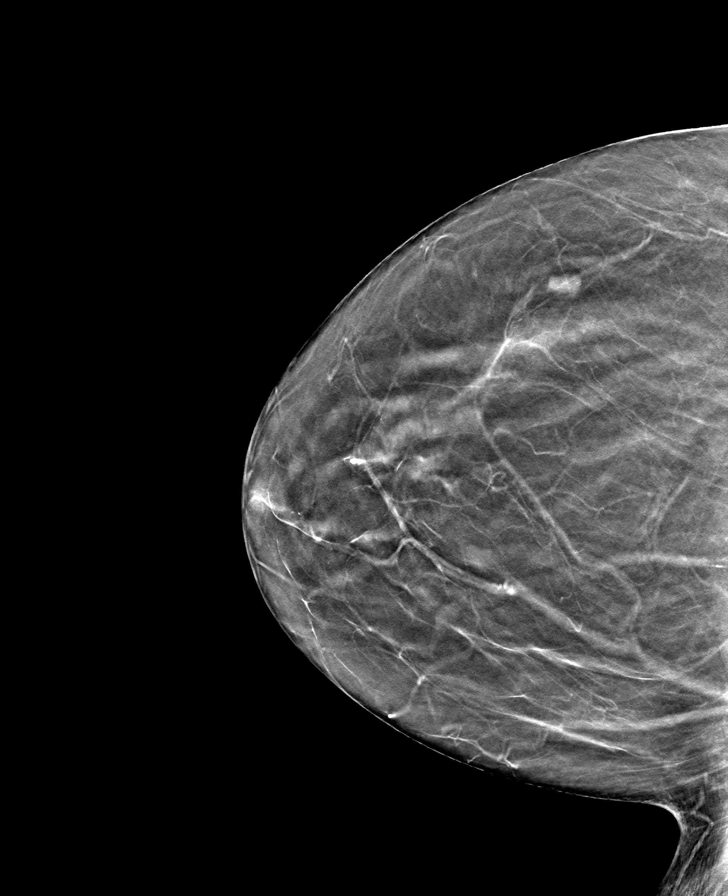

[R MLO tomo · tomo slice 31/61.0]
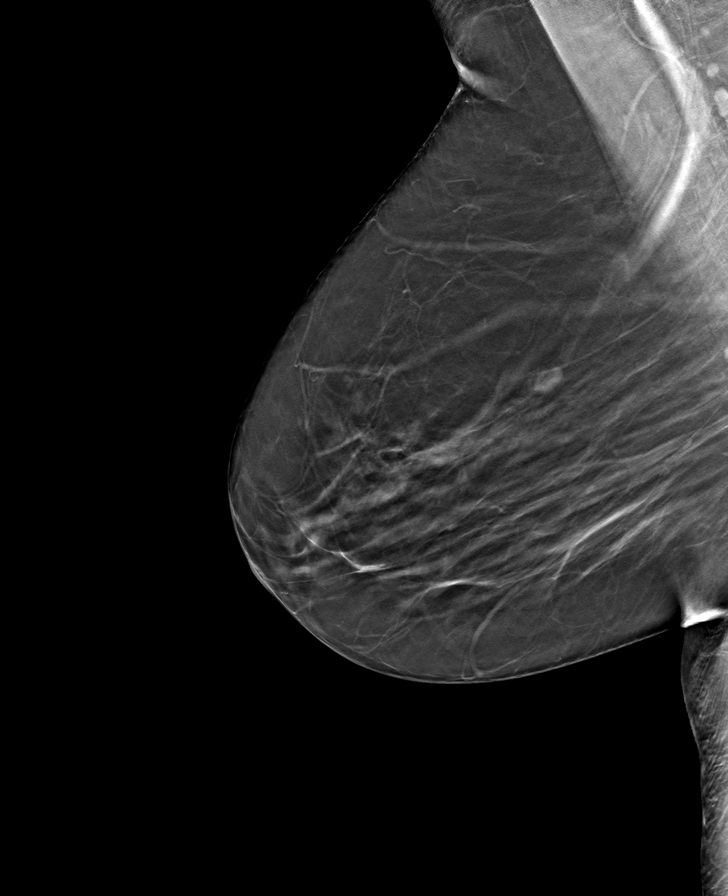

[L CC tomo · tomo slice 27/54.0]
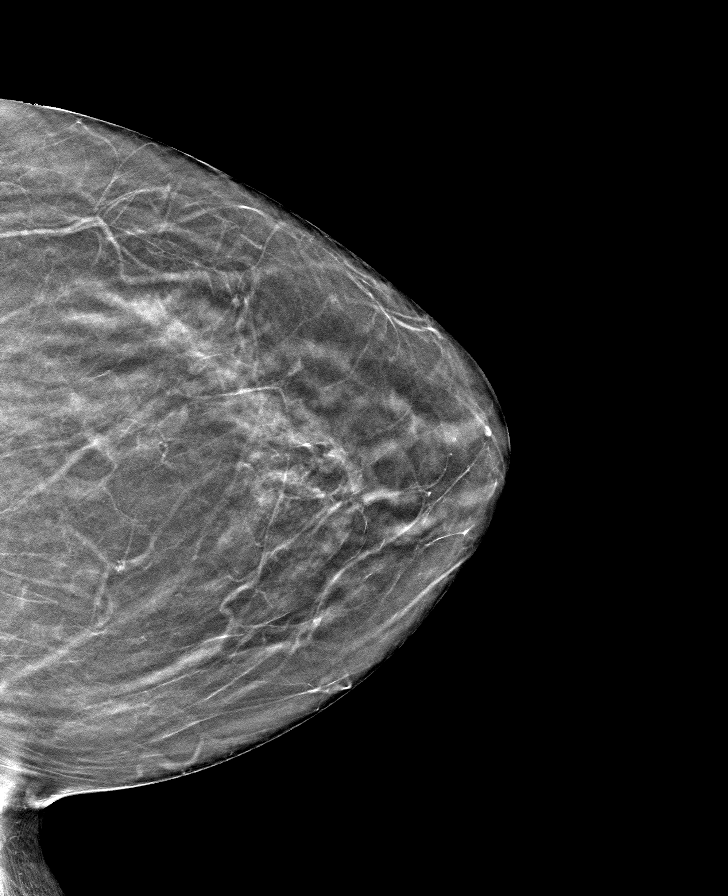

[8 of 24 positions shown; findings below may reference images not displayed]

ACR Breast Density Category b: There are scattered areas of
fibroglandular density.
FINDINGS: There are no findings suspicious for malignancy.
IMPRESSION: No mammographic evidence of malignancy. A result letter of this
screening mammogram will be mailed directly to the patient.

RECOMMENDATION:
Screening mammogram in one year. (Code:51-O-LD2)

BI-RADS CATEGORY  1: Negative.

## 2022-11-19 ENCOUNTER — Encounter (INDEPENDENT_AMBULATORY_CARE_PROVIDER_SITE_OTHER): Payer: Medicare HMO | Admitting: Ophthalmology

## 2022-11-19 DIAGNOSIS — H35033 Hypertensive retinopathy, bilateral: Secondary | ICD-10-CM | POA: Diagnosis not present

## 2022-11-19 DIAGNOSIS — H43813 Vitreous degeneration, bilateral: Secondary | ICD-10-CM | POA: Diagnosis not present

## 2022-11-19 DIAGNOSIS — H353122 Nonexudative age-related macular degeneration, left eye, intermediate dry stage: Secondary | ICD-10-CM | POA: Diagnosis not present

## 2022-11-19 DIAGNOSIS — D3132 Benign neoplasm of left choroid: Secondary | ICD-10-CM | POA: Diagnosis not present

## 2022-11-19 DIAGNOSIS — H353211 Exudative age-related macular degeneration, right eye, with active choroidal neovascularization: Secondary | ICD-10-CM

## 2022-11-19 DIAGNOSIS — I10 Essential (primary) hypertension: Secondary | ICD-10-CM

## 2022-11-22 ENCOUNTER — Encounter (INDEPENDENT_AMBULATORY_CARE_PROVIDER_SITE_OTHER): Payer: Self-pay

## 2022-12-14 ENCOUNTER — Encounter: Payer: Self-pay | Admitting: Internal Medicine

## 2022-12-14 ENCOUNTER — Ambulatory Visit (INDEPENDENT_AMBULATORY_CARE_PROVIDER_SITE_OTHER): Payer: Medicare HMO | Admitting: Internal Medicine

## 2022-12-14 VITALS — BP 142/82 | HR 73 | Temp 98.2°F | Resp 96 | Ht <= 58 in | Wt 150.0 lb

## 2022-12-14 DIAGNOSIS — R7303 Prediabetes: Secondary | ICD-10-CM | POA: Diagnosis not present

## 2022-12-14 LAB — COMPREHENSIVE METABOLIC PANEL
ALT: 9 U/L (ref 0–35)
AST: 15 U/L (ref 0–37)
Albumin: 3.9 g/dL (ref 3.5–5.2)
Alkaline Phosphatase: 82 U/L (ref 39–117)
BUN: 21 mg/dL (ref 6–23)
CO2: 28 mEq/L (ref 19–32)
Calcium: 9.3 mg/dL (ref 8.4–10.5)
Chloride: 107 meq/L (ref 96–112)
Creatinine, Ser: 0.94 mg/dL (ref 0.40–1.20)
GFR: 58.95 mL/min — ABNORMAL LOW (ref >60.00)
Glucose, Bld: 91 mg/dL (ref 70–99)
Potassium: 4.5 meq/L (ref 3.5–5.1)
Sodium: 141 meq/L (ref 135–145)
Total Bilirubin: 0.5 mg/dL (ref 0.2–1.2)
Total Protein: 7.5 g/dL (ref 6.0–8.3)

## 2022-12-14 LAB — POCT GLYCOSYLATED HEMOGLOBIN (HGB A1C): Hemoglobin A1C: 5.8 % — AB (ref 4.0–5.6)

## 2022-12-14 LAB — CBC
HCT: 37.2 % (ref 36.0–46.0)
Hemoglobin: 12 g/dL (ref 12.0–15.0)
MCHC: 32.4 g/dL (ref 30.0–36.0)
MCV: 96.5 fl (ref 78.0–100.0)
Platelets: 350 10*3/uL (ref 150.0–400.0)
RBC: 3.85 Mil/uL — ABNORMAL LOW (ref 3.87–5.11)
RDW: 14.2 % (ref 11.5–15.5)
WBC: 5.9 10*3/uL (ref 4.0–10.5)

## 2022-12-14 NOTE — Assessment & Plan Note (Signed)
POC HGA1c done and lower at 5.8. Counseled about diet and exercise and she has made improvements.

## 2022-12-14 NOTE — Progress Notes (Signed)
   Subjective:   Patient ID: Alexandra Cruz, female    DOB: 10/29/1945, 77 y.o.   MRN: [id number]  HPI The patient is a 77 YO female coming in for follow up.   Review of Systems  Constitutional: Negative.   HENT: Negative.    Eyes: Negative.   Respiratory:  Negative for cough, chest tightness and shortness of breath.   Cardiovascular:  Negative for chest pain, palpitations and leg swelling.  Gastrointestinal:  Negative for abdominal distention, abdominal pain, constipation, diarrhea, nausea and vomiting.  Musculoskeletal: Negative.   Skin: Negative.   Neurological: Negative.   Psychiatric/Behavioral: Negative.      Objective:  Physical Exam Constitutional:      Appearance: She is well-developed.  HENT:     Head: Normocephalic and atraumatic.  Cardiovascular:     Rate and Rhythm: Normal rate and regular rhythm.  Pulmonary:     Effort: Pulmonary effort is normal. No respiratory distress.     Breath sounds: Normal breath sounds. No wheezing or rales.  Abdominal:     General: Bowel sounds are normal. There is no distension.     Palpations: Abdomen is soft.     Tenderness: There is no abdominal tenderness. There is no rebound.  Musculoskeletal:     Cervical back: Normal range of motion.  Skin:    General: Skin is warm and dry.  Neurological:     Mental Status: She is alert and oriented to person, place, and time.     Coordination: Coordination normal.     Vitals:   12/14/22 1016 12/14/22 1018 12/14/22 1030  BP: (!) 160/80 (!) 160/80 (!) 142/82  Pulse: 73    Resp: (!) 96    Temp: 98.2 F (36.8 C)    TempSrc: Oral    Weight: 150 lb (68 kg)    Height: 4\' 10"  (1.473 m)      Assessment & Plan:

## 2022-12-18 ENCOUNTER — Other Ambulatory Visit: Payer: Self-pay | Admitting: Internal Medicine

## 2022-12-18 DIAGNOSIS — Z1231 Encounter for screening mammogram for malignant neoplasm of breast: Secondary | ICD-10-CM

## 2023-01-07 ENCOUNTER — Encounter (INDEPENDENT_AMBULATORY_CARE_PROVIDER_SITE_OTHER): Payer: Medicare HMO | Admitting: Ophthalmology

## 2023-01-07 DIAGNOSIS — D3132 Benign neoplasm of left choroid: Secondary | ICD-10-CM | POA: Diagnosis not present

## 2023-01-07 DIAGNOSIS — H353231 Exudative age-related macular degeneration, bilateral, with active choroidal neovascularization: Secondary | ICD-10-CM

## 2023-01-07 DIAGNOSIS — H35033 Hypertensive retinopathy, bilateral: Secondary | ICD-10-CM | POA: Diagnosis not present

## 2023-01-07 DIAGNOSIS — I10 Essential (primary) hypertension: Secondary | ICD-10-CM | POA: Diagnosis not present

## 2023-01-07 DIAGNOSIS — H43813 Vitreous degeneration, bilateral: Secondary | ICD-10-CM | POA: Diagnosis not present

## 2023-01-15 DIAGNOSIS — H524 Presbyopia: Secondary | ICD-10-CM | POA: Diagnosis not present

## 2023-01-15 DIAGNOSIS — H353230 Exudative age-related macular degeneration, bilateral, stage unspecified: Secondary | ICD-10-CM | POA: Diagnosis not present

## 2023-01-15 DIAGNOSIS — Z961 Presence of intraocular lens: Secondary | ICD-10-CM | POA: Diagnosis not present

## 2023-01-15 DIAGNOSIS — H04123 Dry eye syndrome of bilateral lacrimal glands: Secondary | ICD-10-CM | POA: Diagnosis not present

## 2023-01-15 DIAGNOSIS — H35363 Drusen (degenerative) of macula, bilateral: Secondary | ICD-10-CM | POA: Diagnosis not present

## 2023-01-15 DIAGNOSIS — D3132 Benign neoplasm of left choroid: Secondary | ICD-10-CM | POA: Diagnosis not present

## 2023-01-16 DIAGNOSIS — Z0100 Encounter for examination of eyes and vision without abnormal findings: Secondary | ICD-10-CM | POA: Diagnosis not present

## 2023-02-05 ENCOUNTER — Ambulatory Visit
Admission: RE | Admit: 2023-02-05 | Discharge: 2023-02-05 | Disposition: A | Discharge status: Home / Self Care | Payer: Medicare HMO | Source: Ambulatory Visit | Attending: Internal Medicine | Admitting: Internal Medicine

## 2023-02-05 DIAGNOSIS — Z1231 Encounter for screening mammogram for malignant neoplasm of breast: Secondary | ICD-10-CM | POA: Diagnosis not present

## 2023-02-06 LAB — HM MAMMOGRAPHY

## 2023-02-07 ENCOUNTER — Encounter: Payer: Self-pay | Admitting: Internal Medicine

## 2023-02-25 ENCOUNTER — Encounter (INDEPENDENT_AMBULATORY_CARE_PROVIDER_SITE_OTHER): Payer: Medicare HMO | Admitting: Ophthalmology

## 2023-02-25 DIAGNOSIS — I10 Essential (primary) hypertension: Secondary | ICD-10-CM | POA: Diagnosis not present

## 2023-02-25 DIAGNOSIS — D3132 Benign neoplasm of left choroid: Secondary | ICD-10-CM | POA: Diagnosis not present

## 2023-02-25 DIAGNOSIS — H353231 Exudative age-related macular degeneration, bilateral, with active choroidal neovascularization: Secondary | ICD-10-CM | POA: Diagnosis not present

## 2023-02-25 DIAGNOSIS — H43813 Vitreous degeneration, bilateral: Secondary | ICD-10-CM

## 2023-02-25 DIAGNOSIS — H35033 Hypertensive retinopathy, bilateral: Secondary | ICD-10-CM

## 2023-04-22 ENCOUNTER — Encounter (INDEPENDENT_AMBULATORY_CARE_PROVIDER_SITE_OTHER): Payer: Medicare HMO | Admitting: Ophthalmology

## 2023-04-22 DIAGNOSIS — D3132 Benign neoplasm of left choroid: Secondary | ICD-10-CM

## 2023-04-22 DIAGNOSIS — I10 Essential (primary) hypertension: Secondary | ICD-10-CM | POA: Diagnosis not present

## 2023-04-22 DIAGNOSIS — H43813 Vitreous degeneration, bilateral: Secondary | ICD-10-CM | POA: Diagnosis not present

## 2023-04-22 DIAGNOSIS — H35033 Hypertensive retinopathy, bilateral: Secondary | ICD-10-CM

## 2023-04-22 DIAGNOSIS — H353231 Exudative age-related macular degeneration, bilateral, with active choroidal neovascularization: Secondary | ICD-10-CM

## 2023-05-31 ENCOUNTER — Other Ambulatory Visit: Payer: Self-pay | Admitting: Internal Medicine

## 2023-05-31 MED ORDER — AMLODIPINE BESYLATE 5 MG PO TABS: 5.0000 mg | ORAL_TABLET | Freq: Every day | ORAL | 1 refills | Status: DC

## 2023-05-31 MED ORDER — LOSARTAN POTASSIUM 100 MG PO TABS: ORAL_TABLET | ORAL | 1 refills | Status: DC

## 2023-05-31 MED ORDER — LEVOTHYROXINE SODIUM 50 MCG PO TABS: 50.0000 ug | ORAL_TABLET | Freq: Every day | ORAL | 1 refills | Status: DC

## 2023-05-31 NOTE — Telephone Encounter (Signed)
 Copied from CRM [id number]. Topic: Clinical - Medication Refill >> May 31, 2023 10:09 AM Drema Balzarine wrote: Most Recent Primary Care Visit:  Provider: Hillard Danker A  Department: [facility]  Visit Type: OFFICE VISIT  Date: 12/14/2022  Medication: amLODipine, losartan , levothyroxine   Has the patient contacted their pharmacy? Yes (Agent: If no, request that the patient contact the pharmacy for the refill. If patient does not wish to contact the pharmacy document the reason why and proceed with request.) (Agent: If yes, when and what did the pharmacy advise?)  Is this the correct pharmacy for this prescription? Yes If no, delete pharmacy and type the correct one.  This is the patient's preferred pharmacy:   [id number] [place], [place] - [phone] AT [facility] & [address] [place] [address] Phone: [phone] Fax: [phone]   Has the prescription been filled recently? Yes  Is the patient out of the medication? No, 6 more of each   Has the patient been seen for an appointment in the last year OR does the patient have an upcoming appointment? Yes  Can we respond through MyChart? Yes  Agent: Please be advised that Rx refills may take up to 3 business days. We ask that you follow-up with your pharmacy.

## 2023-06-05 ENCOUNTER — Ambulatory Visit (INDEPENDENT_AMBULATORY_CARE_PROVIDER_SITE_OTHER): Payer: Medicare HMO

## 2023-06-05 VITALS — Ht <= 58 in | Wt 150.0 lb

## 2023-06-05 DIAGNOSIS — Z0000 Encounter for general adult medical examination without abnormal findings: Secondary | ICD-10-CM | POA: Diagnosis not present

## 2023-06-05 NOTE — Patient Instructions (Addendum)
 Ms. Vise , Thank you for taking time to come for your Medicare Wellness Visit. I appreciate your ongoing commitment to your health goals. Please review the following plan we discussed and let me know if I can assist you in the future.   Referrals/Orders/Follow-Ups/Clinician Recommendations: Aim for 30 minutes of exercise or brisk walking, 6-8 glasses of water, and 5 servings of fruits and vegetables each day. Educated and advised to get COVID, Hepatitis C, Influenza, and Shingles vaccines at local pharmacy.  This is a list of the screening recommended for you and due dates:  Health Maintenance  Topic Date Due   Hepatitis C Screening  Never done   Zoster (Shingles) Vaccine (1 of 2) Never done   COVID-19 Vaccine (1 - 2024-25 season) Never done   Flu Shot  07/08/2023*   Medicare Annual Wellness Visit  06/04/2024   DTaP/Tdap/Td vaccine (2 - Td or Tdap) 06/13/2024   Pneumonia Vaccine  Completed   DEXA scan (bone density measurement)  Completed   HPV Vaccine  Aged Out   Colon Cancer Screening  Discontinued  *Topic was postponed. The date shown is not the original due date.    Advanced directives: (Provided) Advance directive discussed with you today. I have provided a copy for you to complete at home and have notarized. Once this is complete, please bring a copy in to our office so we can scan it into your chart.   Next Medicare Annual Wellness Visit scheduled for next year: Yes - 06/2024

## 2023-06-05 NOTE — Progress Notes (Addendum)
 Subjective:  Please attest and cosign this visit due to patients primary care provider not being in the office at the time the visit was completed.  (Pt of Dr Dr, Hillard Danker)   Alexandra Cruz is a 78 y.o. who presents for a Medicare Wellness preventive visit.  Visit Complete: Virtual I connected with  Rob Bunting on 06/05/23 by a audio enabled telemedicine application and verified that I am speaking with the correct person using two identifiers.  Patient Location: Home  Provider Location: Office/Clinic  I discussed the limitations of evaluation and management by telemedicine. The patient expressed understanding and agreed to proceed.  Vital Signs: Because this visit was a virtual/telehealth visit, some criteria may be missing or patient reported. Any vitals not documented were not able to be obtained and vitals that have been documented are patient reported.  VideoDeclined- This patient declined Librarian, academic. Therefore the visit was completed with audio only.  AWV Questionnaire: No: Patient Medicare AWV questionnaire was not completed prior to this visit.  Cardiac Risk Factors include: advanced age (>16men, >61 women);hypertension;obesity (BMI >30kg/m2)     Objective:    Today's Vitals   06/05/23 1358  Weight: 150 lb (68 kg)  Height: 4\' 10"  (1.473 m)   Body mass index is 31.35 kg/m.     06/05/2023    1:57 PM 08/03/2021   10:21 AM 07/12/2014    6:32 PM 10/31/2011    6:50 AM  Advanced Directives  Does Patient Have a Medical Advance Directive? No No No Patient does not have advance directive;Patient would not like information  Would patient like information on creating a medical advance directive? Yes (MAU/Ambulatory/Procedural Areas - Information given) No - Patient declined    Pre-existing out of facility DNR order (yellow form or pink MOST form)    No    Current Medications (verified) Outpatient Encounter Medications  as of 06/05/2023  Medication Sig   amLODipine (NORVASC) 5 MG tablet Take 1 tablet (5 mg total) by mouth daily.   CALCIUM CITRATE-VITAMIN D PO Take 1 tablet by mouth daily.   Cholecalciferol (VITAMIN D3) 75 MCG (3000 UT) TABS Take 1 capsule by mouth daily.   levothyroxine (SYNTHROID) 50 MCG tablet Take 1 tablet (50 mcg total) by mouth daily before breakfast.   losartan (COZAAR) 100 MG tablet TAKE 1 TABLET(100 MG) BY MOUTH DAILY   Multiple Vitamins-Minerals (PRESERVISION AREDS 2 PO) Take 1 tablet by mouth 2 (two) times daily.   trimethoprim-polymyxin b (POLYTRIM) ophthalmic solution    UNABLE TO FIND Place into the right eye every 30 (thirty) days. Med Name: Avastin Steroid Eye injection   No facility-administered encounter medications on file as of 06/05/2023.    Allergies (verified) Ace inhibitors, Bactrim [sulfamethoxazole-trimethoprim], and Metoprolol   History: Past Medical History:  Diagnosis Date   Adrenal adenoma, right    Anal fissure    Anemia    Anxiety    Colon polyps    Gallstones    GERD (gastroesophageal reflux disease)    Glaucoma    Hypertension    Osteopenia    Osteoporosis    Pneumonia    Thyroid disease    Past Surgical History:  Procedure Laterality Date   ABDOMINAL HYSTERECTOMY  04/09/1977   BLADDER NECK RECONSTRUCTION     BLADDER SUSPENSION     x2   CATARACT EXTRACTION, BILATERAL Bilateral    CHOLECYSTECTOMY  04/09/1996   INGUINAL HERNIA REPAIR Right 2003, 2007   LAPAROSCOPIC INTERNAL  HERNIA REPAIR     spiglian hernia   UPPER GASTROINTESTINAL ENDOSCOPY  05/26/2021   Dr.Mansouraty   VAGINAL PROLAPSE REPAIR  04/10/1987   Family History  Problem Relation Age of Onset   Hypertension Mother    Arthritis Mother    Hearing loss Mother    Colon polyps Mother    Pneumonia Father    Hypertension Sister    Diabetes Sister    Heart disease Sister    Hyperlipidemia Brother    Diabetes Brother    Hyperlipidemia Brother    Hypertension Brother     Asthma Brother    Other Brother        COVID   Diabetes Maternal Grandfather    Liver disease Daughter        fatty liver   Diabetes Other    Hypertension Other    Obesity Other    Esophageal cancer Neg Hx    Colon cancer Neg Hx    Inflammatory bowel disease Neg Hx    Pancreatic cancer Neg Hx    Rectal cancer Neg Hx    Stomach cancer Neg Hx    Social History   Socioeconomic History   Marital status: Divorced    Spouse name: Not on file   Number of children: 3   Years of education: Not on file   Highest education level: Not on file  Occupational History   Occupation: retired  Tobacco Use   Smoking status: Never   Smokeless tobacco: Never  Vaping Use   Vaping status: Never Used  Substance and Sexual Activity   Alcohol use: No   Drug use: No   Sexual activity: Not on file  Other Topics Concern   Not on file  Social History Narrative   Born: [place], [place]   Occupation: worked in Interior and spatial designer, Veterinary surgeon, and Pharmacologist, 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   Divorced   3 children, 1 passed away at age 32 (Microbiologist), 2 daughters alive   Social Drivers of Corporate investment banker Strain: Low Risk  (06/05/2023)   Overall Financial Resource Strain (CARDIA)    Difficulty of Paying Living Expenses: Not hard at all  Food Insecurity: No Food Insecurity (06/05/2023)   Hunger Vital Sign    Worried About Running Out of Food in the Last Year: Never true    Ran Out of Food in the Last Year: Never true  Transportation Needs: No Transportation Needs (06/05/2023)   PRAPARE - Administrator, Civil Service (Medical): No    Lack of Transportation (Non-Medical): No  Physical Activity: Sufficiently Active (06/05/2023)   Exercise Vital Sign    Days of Exercise per Week: 5 days    Minutes of Exercise per Session: 30 min  Stress: No Stress Concern Present (06/05/2023)   [employer] of Occupational Health - Occupational Stress Questionnaire    Feeling of Stress : Not at all   Social Connections: Socially Isolated (06/05/2023)   Social Connection and Isolation Panel [NHANES]    Frequency of Communication with Friends and Family: More than three times a week    Frequency of Social Gatherings with Friends and Family: Once a week    Attends Religious Services: Never    Database administrator or Organizations: No    Attends Banker Meetings: Never    Marital Status: Divorced    Tobacco Counseling - Non Smoker Counseling given - N/A   Clinical Intake: Completed 06/05/2023  Pre-visit preparation completed: Yes  Pain : No/denies pain     BMI - recorded: 31.35 Nutritional Status: BMI > 30  Obese Nutritional Risks: None Diabetes: No  How often do you need to have someone help you when you read instructions, pamphlets, or other written materials from your doctor or pharmacy?: 1 - Never  Interpreter Needed?: No  Information entered by :: Hassell Halim, CMA   Activities of Daily Living: Completed 06/05/2023    06/05/2023    2:01 PM  In your present state of health, do you have any difficulty performing the following activities:  Hearing? 1  Comment appt w/[facility] - inquire of hearing aids  Vision? 0  Difficulty concentrating or making decisions? 0  Walking or climbing stairs? 0  Dressing or bathing? 0  Doing errands, shopping? 0  Preparing Food and eating ? N  Using the Toilet? N  In the past six months, have you accidently leaked urine? N  Do you have problems with loss of bowel control? N  Managing your Medications? N  Managing your Finances? N  Housekeeping or managing your Housekeeping? N    Patient Care Team: Myrlene Broker, MD as PCP - General (Internal Medicine)  Indicate any recent Medical Services you may have received from other than [facility] providers in the past year (date may be approximate).     Assessment:   This is a routine wellness examination for [place].  Hearing/Vision screen Hearing  Screening - Comments:: Yes - appt w/[facility] to inquire of hearing aids in 06/2023 Vision Screening - Comments:: Wears rx glasses - up to date with routine eye exams with Dr Ashley Royalty   Goals Addressed               This Visit's Progress     Patient Stated (pt-stated)        Patient stated that she plans to remain healthy.       Depression Screen Completed 06/05/2023    06/05/2023    2:07 PM 05/31/2022    9:07 AM 08/03/2021   10:32 AM 06/06/2020    9:12 AM  PHQ 2/9 Scores  PHQ - 2 Score 0 0 0 0  PHQ- 9 Score  0      Fall Risk Completed 06/05/2023    06/05/2023    2:03 PM 12/14/2022   10:19 AM 05/31/2022    9:07 AM 08/03/2021   10:22 AM  Fall Risk   Falls in the past year? 0 0 0 0  Number falls in past yr: 0 0 0 0  Injury with Fall? 0 0 0 0  Risk for fall due to : No Fall Risks   No Fall Risks  Follow up Falls prevention discussed;Falls evaluation completed Falls evaluation completed Falls evaluation completed Falls evaluation completed    MEDICARE RISK AT HOME: Completed 06/05/2023 Medicare Risk at Home Any stairs in or around the home?: Yes If so, are there any without handrails?: No Home free of loose throw rugs in walkways, pet beds, electrical cords, etc?: Yes Adequate lighting in your home to reduce risk of falls?: Yes Life alert?: No Use of a cane, walker or w/c?: No Grab bars in the bathroom?: Yes Shower chair or bench in shower?: No Elevated toilet seat or a handicapped toilet?: Yes  TIMED UP AND GO:  Was the test performed?  No  Cognitive Function: 6CIT completed        06/05/2023    2:04 PM 08/03/2021   10:36 AM  6CIT Screen  What Year? 0 points 0 points  What month? 0 points 0 points  What time? 0 points 0 points  Count back from 20 0 points 0 points  Months in reverse 0 points 0 points  Repeat phrase 0 points 0 points  Total Score 0 points 0 points    Immunizations Immunization History  Administered Date(s) Administered    PNEUMOCOCCAL CONJUGATE-20 04/24/2021   Pneumococcal Polysaccharide-23 06/14/2014   Tdap 06/14/2014    Screening Tests Health Maintenance  Topic Date Due   Hepatitis C Screening  Never done   Zoster Vaccines- Shingrix (1 of 2) Never done   COVID-19 Vaccine (1 - 2024-25 season) Never done   INFLUENZA VACCINE  07/08/2023 (Originally 11/08/2022)   Medicare Annual Wellness (AWV)  06/04/2024   DTaP/Tdap/Td (2 - Td or Tdap) 06/13/2024   Pneumonia Vaccine 43+ Years old  Completed   DEXA SCAN  Completed   HPV VACCINES  Aged Out   Colonoscopy  Discontinued    Health Maintenance  Health Maintenance Due  Topic Date Due   Hepatitis C Screening  Never done   Zoster Vaccines- Shingrix (1 of 2) Never done   COVID-19 Vaccine (1 - 2024-25 season) Never done   Health Maintenance Items Addressed:06/05/2023.   Pt declines the COVID, Shingles, Hepatitis C, and Influenza vaccines.    Colonoscopy status: Completed 12/10/2018. No longer required due to age (76).  DEXA scan status: Qualifies and Completed 02/02/2022.  Additional Screening:  Vision Screening: Recommended annual ophthalmology exams for early detection of glaucoma and other disorders of the eye. Sees Dr Ashley Royalty annually for eye exam.  Dental Screening: Recommended annual dental exams for proper oral hygiene.  Community Resource Referral / Chronic Care Management: CRR required this visit?  No   CCM required this visit?  No     Plan:     I have personally reviewed and noted the following in the patient's chart:   Medical and social history Use of alcohol, tobacco or illicit drugs  Current medications and supplements including opioid prescriptions. Patient is not currently taking opioid prescriptions. Functional ability and status Nutritional status Physical activity Advanced directives List of other physicians Hospitalizations, surgeries, and ER visits in previous 12 months Vitals Screenings to include cognitive,  depression, and falls Referrals and appointments  In addition, I have reviewed and discussed with patient certain preventive protocols, quality metrics, and best practice recommendations. A written personalized care plan for preventive services as well as general preventive health recommendations were provided to patient.     Darreld Mclean, CMA   06/05/2023   After Visit Summary: (Mail) Due to this being a telephonic visit, the after visit summary with patients personalized plan was offered to patient via mail   Notes: Nothing significant to report at this time.  Medical screening examination/treatment/procedure(s) were performed by non-physician practitioner and as supervising physician I was immediately available for consultation/collaboration.  I agree with above. Jacinta Shoe, MD

## 2023-06-17 ENCOUNTER — Encounter (INDEPENDENT_AMBULATORY_CARE_PROVIDER_SITE_OTHER): Payer: Medicare HMO | Admitting: Ophthalmology

## 2023-06-17 DIAGNOSIS — I10 Essential (primary) hypertension: Secondary | ICD-10-CM | POA: Diagnosis not present

## 2023-06-17 DIAGNOSIS — H353231 Exudative age-related macular degeneration, bilateral, with active choroidal neovascularization: Secondary | ICD-10-CM | POA: Diagnosis not present

## 2023-06-17 DIAGNOSIS — H43813 Vitreous degeneration, bilateral: Secondary | ICD-10-CM | POA: Diagnosis not present

## 2023-06-17 DIAGNOSIS — H35033 Hypertensive retinopathy, bilateral: Secondary | ICD-10-CM | POA: Diagnosis not present

## 2023-06-17 DIAGNOSIS — D3132 Benign neoplasm of left choroid: Secondary | ICD-10-CM

## 2023-06-25 ENCOUNTER — Encounter: Payer: Self-pay | Admitting: Internal Medicine

## 2023-06-25 ENCOUNTER — Ambulatory Visit (INDEPENDENT_AMBULATORY_CARE_PROVIDER_SITE_OTHER): Payer: Medicare HMO | Admitting: Internal Medicine

## 2023-06-25 VITALS — BP 132/96 | HR 76 | Temp 97.9°F | Ht <= 58 in | Wt 142.0 lb

## 2023-06-25 DIAGNOSIS — E039 Hypothyroidism, unspecified: Secondary | ICD-10-CM | POA: Diagnosis not present

## 2023-06-25 DIAGNOSIS — Z0000 Encounter for general adult medical examination without abnormal findings: Secondary | ICD-10-CM | POA: Diagnosis not present

## 2023-06-25 DIAGNOSIS — K449 Diaphragmatic hernia without obstruction or gangrene: Secondary | ICD-10-CM | POA: Diagnosis not present

## 2023-06-25 DIAGNOSIS — R7303 Prediabetes: Secondary | ICD-10-CM

## 2023-06-25 DIAGNOSIS — M810 Age-related osteoporosis without current pathological fracture: Secondary | ICD-10-CM

## 2023-06-25 DIAGNOSIS — L900 Lichen sclerosus et atrophicus: Secondary | ICD-10-CM | POA: Diagnosis not present

## 2023-06-25 DIAGNOSIS — H3530 Unspecified macular degeneration: Secondary | ICD-10-CM | POA: Diagnosis not present

## 2023-06-25 DIAGNOSIS — E559 Vitamin D deficiency, unspecified: Secondary | ICD-10-CM

## 2023-06-25 DIAGNOSIS — H9190 Unspecified hearing loss, unspecified ear: Secondary | ICD-10-CM | POA: Insufficient documentation

## 2023-06-25 DIAGNOSIS — Z1159 Encounter for screening for other viral diseases: Secondary | ICD-10-CM | POA: Diagnosis not present

## 2023-06-25 DIAGNOSIS — I10 Essential (primary) hypertension: Secondary | ICD-10-CM

## 2023-06-25 LAB — LIPID PANEL
Cholesterol: 173 mg/dL (ref 0–200)
HDL: 102.1 mg/dL
LDL Cholesterol: 64 mg/dL (ref 0–99)
NonHDL: 71.29
Total CHOL/HDL Ratio: 2
Triglycerides: 35 mg/dL (ref 0.0–149.0)
VLDL: 7 mg/dL (ref 0.0–40.0)

## 2023-06-25 LAB — COMPREHENSIVE METABOLIC PANEL WITH GFR
ALT: 10 U/L (ref 0–35)
AST: 15 U/L (ref 0–37)
Albumin: 4.1 g/dL (ref 3.5–5.2)
Alkaline Phosphatase: 75 U/L (ref 39–117)
BUN: 24 mg/dL — ABNORMAL HIGH (ref 6–23)
CO2: 27 meq/L (ref 19–32)
Calcium: 9.7 mg/dL (ref 8.4–10.5)
Chloride: 105 meq/L (ref 96–112)
Creatinine, Ser: 0.88 mg/dL (ref 0.40–1.20)
GFR: 63.57 mL/min
Glucose, Bld: 92 mg/dL (ref 70–99)
Potassium: 4.4 meq/L (ref 3.5–5.1)
Sodium: 139 meq/L (ref 135–145)
Total Bilirubin: 0.5 mg/dL (ref 0.2–1.2)
Total Protein: 7.5 g/dL (ref 6.0–8.3)

## 2023-06-25 LAB — CBC
HCT: 36.4 % (ref 36.0–46.0)
Hemoglobin: 12.1 g/dL (ref 12.0–15.0)
MCHC: 33.2 g/dL (ref 30.0–36.0)
MCV: 96.2 fl (ref 78.0–100.0)
Platelets: 360 10*3/uL (ref 150.0–400.0)
RBC: 3.78 Mil/uL — ABNORMAL LOW (ref 3.87–5.11)
RDW: 13.6 % (ref 11.5–15.5)
WBC: 5.7 10*3/uL (ref 4.0–10.5)

## 2023-06-25 LAB — VITAMIN D 25 HYDROXY (VIT D DEFICIENCY, FRACTURES): VITD: 59.1 ng/mL (ref 30.00–100.00)

## 2023-06-25 LAB — HEMOGLOBIN A1C: Hgb A1c MFr Bld: 6 % (ref 4.6–6.5)

## 2023-06-25 LAB — URIC ACID: Uric Acid, Serum: 4.5 mg/dL (ref 2.4–7.0)

## 2023-06-25 LAB — TSH: TSH: 1.92 u[IU]/mL (ref 0.35–5.50)

## 2023-06-25 MED ORDER — TRIAMCINOLONE ACETONIDE 0.1 % EX CREA: 1.0000 | TOPICAL_CREAM | Freq: Two times a day (BID) | CUTANEOUS | 3 refills | Status: AC

## 2023-06-25 NOTE — Assessment & Plan Note (Signed)
 Referral placed to audiology for someone in her medicaid network so she can get hearing aids covered.

## 2023-06-25 NOTE — Progress Notes (Signed)
   Subjective:   Patient ID: Alexandra Cruz, female    DOB: [birth year], 78 y.o.   MRN: [id number]  HPI The patient is here for physical. BP log with normal BP at home. New rash inguinal area with some itching. Needs referral to in network medicaid audiology to get hearing aids covered  PMH, [facility], social history reviewed and updated  Review of Systems  Constitutional: Negative.   HENT:  Positive for hearing loss.   Eyes: Negative.   Respiratory:  Negative for cough, chest tightness and shortness of breath.   Cardiovascular:  Negative for chest pain, palpitations and leg swelling.  Gastrointestinal:  Negative for abdominal distention, abdominal pain, constipation, diarrhea, nausea and vomiting.  Musculoskeletal: Negative.   Skin: Negative.   Neurological: Negative.   Psychiatric/Behavioral: Negative.      Objective:  Physical Exam Constitutional:      Appearance: She is well-developed.  HENT:     Head: Normocephalic and atraumatic.  Cardiovascular:     Rate and Rhythm: Normal rate and regular rhythm.  Pulmonary:     Effort: Pulmonary effort is normal. No respiratory distress.     Breath sounds: Normal breath sounds. No wheezing or rales.  Abdominal:     General: Bowel sounds are normal. There is no distension.     Palpations: Abdomen is soft.     Tenderness: There is no abdominal tenderness. There is no rebound.  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  Skin:    General: Skin is warm and dry.  Neurological:     Mental Status: She is alert and oriented to person, place, and time.     Coordination: Coordination normal.     Vitals:   06/25/23 0902 06/25/23 0912  BP: (!) 132/96 (!) 132/96  Pulse: 76   Temp: 97.9 F (36.6 C)   TempSrc: Oral   SpO2: 97%   Weight: 142 lb (64.4 kg)   Height: 4\' 10"  (1.473 m)     Assessment & Plan:

## 2023-06-25 NOTE — Assessment & Plan Note (Signed)
 Checking TSH and adjust synthroid 50 mcg daily as needed.

## 2023-06-25 NOTE — Assessment & Plan Note (Signed)
 Flu shot yearly. Pneumonia complete. Shingrix counseled. Tetanus up to date. Colonoscopy aged out future. Mammogram up to date, pap smear aged out and dexa up to date. Counseled about sun safety and mole surveillance. Counseled about the dangers of distracted driving. Given 10 year screening recommendations.

## 2023-06-25 NOTE — Assessment & Plan Note (Signed)
 Checking HgA1c and adjust as needed.

## 2023-06-25 NOTE — Assessment & Plan Note (Signed)
 Not on medication and have counseled in past about this.

## 2023-06-25 NOTE — Assessment & Plan Note (Signed)
 Getting shots in right eye, left eye stable and under observation.

## 2023-06-25 NOTE — Assessment & Plan Note (Signed)
 BP log from home at goal. Continue amlodipine 5 mg daily and losartan 100 mg daily. Checking CMP and CBC and lipid panel and adjust as needed.

## 2023-06-25 NOTE — Assessment & Plan Note (Signed)
 Changed diet and no longer uses medication for this.

## 2023-06-25 NOTE — Assessment & Plan Note (Signed)
 Rx triamcinolone to use topically and can alternate with vaseline. This is in the perineum and around anus as well.

## 2023-06-25 NOTE — Patient Instructions (Signed)
 We are sending in triamcinolone cream to use on the groin area to help and use vaseline as well.

## 2023-06-26 LAB — HEPATITIS C ANTIBODY: Hepatitis C Ab: NONREACTIVE

## 2023-06-27 ENCOUNTER — Encounter: Payer: Self-pay | Admitting: Internal Medicine

## 2023-07-30 ENCOUNTER — Ambulatory Visit: Attending: Internal Medicine | Admitting: Audiologist

## 2023-07-30 DIAGNOSIS — H903 Sensorineural hearing loss, bilateral: Secondary | ICD-10-CM | POA: Diagnosis not present

## 2023-07-30 NOTE — Procedures (Signed)
  Outpatient Audiology and [facility] [address] [place], [place]  [address] [phone]  AUDIOLOGICAL  EVALUATION  NAME: Alexandra Cruz     DOB:   06-17-1945      MRN: [id number]                                                                                     DATE: 07/30/2023     REFERENT: Adelia Homestead, MD STATUS: Outpatient DIAGNOSIS: Sensorineural Hearing Loss   History: Alexandra Cruz was seen for an audiological evaluation due to difficulty obtaining hearing aids. Alexandra Cruz is aware she has hearing loss in both ears. She has tinnitus which is not bothersome. She recently had a hearing test with [facility]. They told her she had a severe sloping hearing loss in both ears. They offered to orchestrate one hearing aid through Medicaid and then she could finance the other. She would like two hearing aids without having to use financing. Alexandra Cruz was informed when scheduling that [facility] does not work with hearing aids. She has no other concerns.   Evaluation:  Otoscopy showed a clear view of the tympanic membranes, bilaterally Tympanometry results were consistent with normal middle ear function, bilaterally   Audiometric testing was completed using Conventional Audiometry techniques with insert earphones and supraural headphones. 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 Speech Recognition Thresholds were obtained at 40dB HL in the right ear and at 35dB HL in the left ear. Word Recognition Testing was completed at  40dB SL and Alexandra Cruz scored 80% in the right ear and 92% in the right ear.    Results:  The test results were reviewed with Alexandra Cruz. She does need hearing aids for both ears. Recommend seeing a Medicaid hearing aid provider like [facility]. The only other option is [facility] in [place]. One hearing aid is better than none.  Audiogram printed and provided to Alexandra Cruz.    Recommendations: Hearing aids  recommended for both ears. Patient given list of local hearing aid providers. There are no provider from whom she will receive two hearing aids at no cost. Recommend going back to [facility] for the medicaid aids, or go take up daughter's offer of purchasing her hearing aids from [employer].   32 minutes spent testing and counseling on results.   If you have any questions please feel free to contact me at [phone].  Raynald Calkins Stalnaker Au.D.  Audiologist   07/30/2023  11:15 AM  Cc: Adelia Homestead, MD

## 2023-08-12 ENCOUNTER — Encounter (INDEPENDENT_AMBULATORY_CARE_PROVIDER_SITE_OTHER): Admitting: Ophthalmology

## 2023-08-12 DIAGNOSIS — H43813 Vitreous degeneration, bilateral: Secondary | ICD-10-CM

## 2023-08-12 DIAGNOSIS — I10 Essential (primary) hypertension: Secondary | ICD-10-CM | POA: Diagnosis not present

## 2023-08-12 DIAGNOSIS — H353231 Exudative age-related macular degeneration, bilateral, with active choroidal neovascularization: Secondary | ICD-10-CM | POA: Diagnosis not present

## 2023-08-12 DIAGNOSIS — H35033 Hypertensive retinopathy, bilateral: Secondary | ICD-10-CM

## 2023-08-12 DIAGNOSIS — D3132 Benign neoplasm of left choroid: Secondary | ICD-10-CM | POA: Diagnosis not present

## 2023-09-11 ENCOUNTER — Telehealth: Payer: Self-pay

## 2023-09-11 ENCOUNTER — Other Ambulatory Visit: Payer: Self-pay | Admitting: Internal Medicine

## 2023-09-11 DIAGNOSIS — Z1231 Encounter for screening mammogram for malignant neoplasm of breast: Secondary | ICD-10-CM

## 2023-09-11 NOTE — Telephone Encounter (Signed)
 Copied from CRM [id number]. Topic: Clinical - Request for Lab/Test Order >> Sep 11, 2023  1:18 PM Alexandra Cruz wrote: Reason for CRM: Pt requesting to retest bloodwork(Lipid panel, vitamin D , TSH, etc) for 72mo f/u I do not see the order in labs - pt would like to be contacted to schedule once order is place - [phone]

## 2023-09-11 NOTE — Telephone Encounter (Signed)
**Note De-identified  [place] Obfuscation** Please advise 

## 2023-09-11 NOTE — Telephone Encounter (Signed)
 Does she have an upcoming visit?

## 2023-09-11 NOTE — Telephone Encounter (Signed)
 She does not and just had her physical in march

## 2023-09-13 NOTE — Telephone Encounter (Signed)
 I do recommend follow up 6 month for sugars we can do labs then. All other labs were normal and do not need repeat at this time.

## 2023-10-07 ENCOUNTER — Encounter (INDEPENDENT_AMBULATORY_CARE_PROVIDER_SITE_OTHER): Admitting: Ophthalmology

## 2023-10-07 DIAGNOSIS — D3132 Benign neoplasm of left choroid: Secondary | ICD-10-CM | POA: Diagnosis not present

## 2023-10-07 DIAGNOSIS — H353231 Exudative age-related macular degeneration, bilateral, with active choroidal neovascularization: Secondary | ICD-10-CM | POA: Diagnosis not present

## 2023-10-07 DIAGNOSIS — H35033 Hypertensive retinopathy, bilateral: Secondary | ICD-10-CM | POA: Diagnosis not present

## 2023-10-07 DIAGNOSIS — I10 Essential (primary) hypertension: Secondary | ICD-10-CM

## 2023-10-07 DIAGNOSIS — H43813 Vitreous degeneration, bilateral: Secondary | ICD-10-CM | POA: Diagnosis not present

## 2023-11-23 ENCOUNTER — Other Ambulatory Visit: Payer: Self-pay | Admitting: Internal Medicine

## 2023-11-25 ENCOUNTER — Other Ambulatory Visit: Payer: Self-pay | Admitting: Internal Medicine

## 2023-11-25 ENCOUNTER — Telehealth: Payer: Self-pay

## 2023-11-25 DIAGNOSIS — R7303 Prediabetes: Secondary | ICD-10-CM

## 2023-11-25 MED ORDER — AMLODIPINE BESYLATE 5 MG PO TABS: 5.0000 mg | ORAL_TABLET | Freq: Every day | ORAL | 1 refills | Status: DC

## 2023-11-25 MED ORDER — LOSARTAN POTASSIUM 100 MG PO TABS: ORAL_TABLET | ORAL | 1 refills | Status: AC

## 2023-11-25 MED ORDER — LEVOTHYROXINE SODIUM 50 MCG PO TABS: 50.0000 ug | ORAL_TABLET | Freq: Every day | ORAL | 1 refills | Status: DC

## 2023-11-25 NOTE — Telephone Encounter (Signed)
 Patient has not made an appointment and will she be needing labs if so I can call and schedule both. OV/Labs

## 2023-11-25 NOTE — Telephone Encounter (Signed)
 Copied from CRM #[id number]. Topic: Clinical - Medication Refill >> Nov 25, 2023  9:51 AM Delon T wrote: Medication: levothyroxine  (SYNTHROID ) 50 MCG tablet losartan  (COZAAR ) 100 MG tablet amLODipine  (NORVASC ) 5 MG tablet 90 day supply Has the patient contacted their pharmacy? Yes (Agent: If no, request that the patient contact the pharmacy for the refill. If patient does not wish to contact the pharmacy document the reason why and proceed with request.) (Agent: If yes, when and what did the pharmacy advise?)  This is the patient's preferred pharmacy:  [id number] [place], [place] - [address] AT [facility] OF [facility] & [address] [place] [address] Phone: [phone] Fax: [phone]  Is this the correct pharmacy for this prescription? Yes If no, delete pharmacy and type the correct one.   Has the prescription been filled recently? Yes  Is the patient out of the medication? Yes  Has the patient been seen for an appointment in the last year OR does the patient have an upcoming appointment? Yes  Can we respond through MyChart? Yes  Agent: Please be advised that Rx refills may take up to 3 business days. We ask that you follow-up with your pharmacy.

## 2023-11-25 NOTE — Telephone Encounter (Signed)
 Copied from CRM #[id number]. Topic: Clinical - Request for Lab/Test Order >> Nov 25, 2023  9:49 AM Delon DASEN wrote: Reason for CRM: Patient would like to schedule labs for 6 month follow up

## 2023-11-26 NOTE — Telephone Encounter (Signed)
 Placed labs okay to get anytime

## 2023-12-02 ENCOUNTER — Encounter (INDEPENDENT_AMBULATORY_CARE_PROVIDER_SITE_OTHER): Admitting: Ophthalmology

## 2023-12-02 DIAGNOSIS — H43813 Vitreous degeneration, bilateral: Secondary | ICD-10-CM

## 2023-12-02 DIAGNOSIS — D3132 Benign neoplasm of left choroid: Secondary | ICD-10-CM

## 2023-12-02 DIAGNOSIS — H353231 Exudative age-related macular degeneration, bilateral, with active choroidal neovascularization: Secondary | ICD-10-CM | POA: Diagnosis not present

## 2023-12-02 DIAGNOSIS — H35033 Hypertensive retinopathy, bilateral: Secondary | ICD-10-CM | POA: Diagnosis not present

## 2023-12-02 DIAGNOSIS — I10 Essential (primary) hypertension: Secondary | ICD-10-CM | POA: Diagnosis not present

## 2024-01-21 DIAGNOSIS — D3132 Benign neoplasm of left choroid: Secondary | ICD-10-CM | POA: Diagnosis not present

## 2024-01-21 DIAGNOSIS — Z961 Presence of intraocular lens: Secondary | ICD-10-CM | POA: Diagnosis not present

## 2024-01-21 DIAGNOSIS — H35363 Drusen (degenerative) of macula, bilateral: Secondary | ICD-10-CM | POA: Diagnosis not present

## 2024-01-21 DIAGNOSIS — H04123 Dry eye syndrome of bilateral lacrimal glands: Secondary | ICD-10-CM | POA: Diagnosis not present

## 2024-01-24 ENCOUNTER — Encounter: Payer: Self-pay | Admitting: *Deleted

## 2024-01-24 NOTE — Progress Notes (Signed)
 Alexandra Cruz                                          MRN: [id number]   01/24/2024   The [facility]BCI Quality Team Specialist reviewed this patient medical record for the purposes of chart review for care gap closure. The following were reviewed: chart review for care gap closure-controlling blood pressure.    VBCI Quality Team

## 2024-01-27 ENCOUNTER — Encounter (INDEPENDENT_AMBULATORY_CARE_PROVIDER_SITE_OTHER): Admitting: Ophthalmology

## 2024-01-27 DIAGNOSIS — I10 Essential (primary) hypertension: Secondary | ICD-10-CM | POA: Diagnosis not present

## 2024-01-27 DIAGNOSIS — H43813 Vitreous degeneration, bilateral: Secondary | ICD-10-CM | POA: Diagnosis not present

## 2024-01-27 DIAGNOSIS — H35033 Hypertensive retinopathy, bilateral: Secondary | ICD-10-CM

## 2024-01-27 DIAGNOSIS — H353231 Exudative age-related macular degeneration, bilateral, with active choroidal neovascularization: Secondary | ICD-10-CM | POA: Diagnosis not present

## 2024-01-27 DIAGNOSIS — D3132 Benign neoplasm of left choroid: Secondary | ICD-10-CM | POA: Diagnosis not present

## 2024-02-12 ENCOUNTER — Ambulatory Visit

## 2024-02-18 ENCOUNTER — Ambulatory Visit: Payer: Self-pay

## 2024-02-18 NOTE — Telephone Encounter (Addendum)
 FYI Only or Action Required?: FYI only for provider: appointment scheduled on 02/21/24.  Patient was last seen in primary care on 06/25/2023 by Rollene Almarie LABOR, MD.  Called Nurse Triage reporting Vaginal Bleeding.  Symptoms began a week ago.  Interventions attempted: OTC medications: Vaseline, aquaphor.  Symptoms are: stable.  Triage Disposition: See PCP When Office is Open (Within 3 Days)  Patient/caregiver understands and will follow disposition?:   Copied from CRM [id number]. Topic: Clinical - Red Word Triage >> Feb 18, 2024 10:23 AM Charolett L wrote: [facility] that prompted transfer to Nurse Triage: Patient stated that she has a case of vaginal bleeding  Pt also reports hx of lichen sclerosus  Reason for Disposition  Tender lump (swelling or ball) at vaginal opening    Not painful, but reports bump on back right labia that possible has small amount of bleeding  Answer Assessment - Initial Assessment Questions Pt reports onset of small lump on right labia about a week ago. No pain or itching but thinks there may be a little blood coming from it when wiping with toilet paper. Pt is unsure as she also has internal/external hemorrhoids and recently had episode of constipation. Scheduled appt with different provider at home office d/t no PCP availability within timeframe. Advised UC or ED for worsening symptoms.   1. SYMPTOM: What's the main symptom you're concerned about? (e.g., pain, itching, dryness)     Bump on back right labia and some blood on toilet paper when wiping  2. LOCATION: Where is the  Bump located? (e.g., inside/outside, left/right)     Bump on back right labia   3. ONSET: When did the  Bump with bleeding  start?     About 1 week ago  4. PAIN: Is there any pain? If Yes, ask: How bad is it? (Scale: 1-10; mild, moderate, severe)     No pain  5. ITCHING: Is there any itching? If Yes, ask: How bad is it? (Scale: 1-10; mild, moderate, severe)      Denies.  6. CAUSE: What do you think is causing the discharge? Have you had the same problem before? What happened then?     Some bleeding when wiping the bump. Pt unsure if it's coming from the bump on labia or from her hx of internal/external hx. Recently was constipated and strained to have BM with blood on TP   7. OTHER SYMPTOMS: Do you have any other symptoms? (e.g., fever, itching, vaginal bleeding, pain with urination, injury to genital area, vaginal foreign body)     Denies  Protocols used: Vaginal Symptoms-A-AH

## 2024-02-21 ENCOUNTER — Ambulatory Visit: Admitting: Internal Medicine

## 2024-02-24 ENCOUNTER — Other Ambulatory Visit: Payer: Self-pay | Admitting: Internal Medicine

## 2024-02-24 ENCOUNTER — Telehealth: Payer: Self-pay

## 2024-02-24 NOTE — Telephone Encounter (Unsigned)
 Copied from CRM #[id number]. Topic: Clinical - Medication Refill >> Feb 24, 2024 10:33 AM Frederich PARAS wrote: Medication: losartan  (COZAAR ) 100 MG table, amLODipine  (NORVASC ) 5 MG tablet, levothyroxine  (SYNTHROID ) 50 MCG tablet  Has the patient contacted their pharmacy? No She says it takes the pharmacy too long ans she doesn't have many left.   This is the patient's preferred pharmacy:  [id number] [place], [place] - [phone] AT [facility] & [address] [place] [address] Phone: [phone] Fax: [phone]  Is this the correct pharmacy for this prescription? Yes If no, delete pharmacy and type the correct one.   Has the prescription been filled recently? Yes  Is the patient out of the medication? No, she has 5 or 6 of each tablet remaining   Has the patient been seen for an appointment in the last year OR does the patient have an upcoming appointment? Yes  Can we respond through MyChart? No  Agent: Please be advised that Rx refills may take up to 3 business days. We ask that you follow-up with your pharmacy.

## 2024-02-24 NOTE — Telephone Encounter (Signed)
 Copied from CRM #[id number]. Topic: General - Other >> Feb 24, 2024 10:40 AM Alexandra Cruz wrote: Reason for CRM: pt  called to check to see if you all received a package from her it was a faxed form from [employer] form [employer] chronic diseases,pt checking the status.

## 2024-02-25 MED ORDER — LEVOTHYROXINE SODIUM 50 MCG PO TABS: 50.0000 ug | ORAL_TABLET | Freq: Every day | ORAL | 1 refills | Status: AC

## 2024-02-25 MED ORDER — LOSARTAN POTASSIUM 100 MG PO TABS: ORAL_TABLET | ORAL | 1 refills | Status: AC

## 2024-02-25 MED ORDER — AMLODIPINE BESYLATE 5 MG PO TABS: 5.0000 mg | ORAL_TABLET | Freq: Every day | ORAL | 1 refills | Status: AC

## 2024-02-27 NOTE — Telephone Encounter (Signed)
 Spoke to pt and she stated that she mailed paperwork to Our office for Dr. Rollene on 02/21/24. Have not received any paperwork from pt. Will notify once received.

## 2024-03-11 ENCOUNTER — Other Ambulatory Visit: Payer: Self-pay

## 2024-03-11 NOTE — Progress Notes (Unsigned)
 Patient appearing on report for [employer] - Hypertension Control report due to last documented ambulatory blood pressure of 132/96 on 06/25/2023.SABRA Next appointment with PCP is     Outreached patient to discuss hypertension control and medication management.   Current antihypertensives:  Amlodipine  5 mg daily Losartan  100 mg daily  Patient has an automated upper arm home BP machine.  Current blood pressure readings: 131/77 Current meal patterns: anti-inflammatory diet for hypothyroidism Current physical activity: increase activity as able Patient denies hypotensive signs and symptoms including dizziness, lightheadedness.  Patient denies hypertensive symptoms including headache, chest pain, shortness of breath.  Patient denies side effects related to any medications.    Assessment/Plan: Currently uncontrolled, but home readings reflect better blood pressure control compared to in-clinic readings Reviewed goal blood pressure <130/80 Reviewed appropriate home BP monitoring technique (avoid caffeine, smoking, and exercise for 30 minutes before checking, rest for at least 5 minutes before taking BP, sit with feet flat on the floor and back against a hard surface, uncross legs, and rest arm on flat surface) Reviewed to check blood pressure weekly, document, and provide at next provider visit Recommend continue medications as prescribed  Woodie Jock, PharmD PGY1 Pharmacy Resident  03/11/2024

## 2024-03-23 ENCOUNTER — Encounter (INDEPENDENT_AMBULATORY_CARE_PROVIDER_SITE_OTHER): Admitting: Ophthalmology

## 2024-03-23 DIAGNOSIS — H43813 Vitreous degeneration, bilateral: Secondary | ICD-10-CM

## 2024-03-23 DIAGNOSIS — I10 Essential (primary) hypertension: Secondary | ICD-10-CM

## 2024-03-23 DIAGNOSIS — D3132 Benign neoplasm of left choroid: Secondary | ICD-10-CM

## 2024-03-23 DIAGNOSIS — H353231 Exudative age-related macular degeneration, bilateral, with active choroidal neovascularization: Secondary | ICD-10-CM | POA: Diagnosis not present

## 2024-03-23 DIAGNOSIS — H35033 Hypertensive retinopathy, bilateral: Secondary | ICD-10-CM

## 2024-04-03 NOTE — Progress Notes (Signed)
 Alexandra Cruz                                          MRN: [id number]   04/03/2024   The [facility] Quality Team Specialist reviewed this patient medical record for the purposes of chart review for care gap closure. The following were reviewed: abstraction for care gap closure-controlling blood pressure.    [facility] Quality Team

## 2024-04-17 ENCOUNTER — Ambulatory Visit
Admission: RE | Admit: 2024-04-17 | Discharge: 2024-04-17 | Disposition: A | Source: Ambulatory Visit | Attending: Internal Medicine | Admitting: Internal Medicine

## 2024-04-17 DIAGNOSIS — Z1231 Encounter for screening mammogram for malignant neoplasm of breast: Secondary | ICD-10-CM

## 2024-04-21 ENCOUNTER — Ambulatory Visit: Payer: Self-pay | Admitting: Internal Medicine

## 2024-05-18 ENCOUNTER — Encounter (INDEPENDENT_AMBULATORY_CARE_PROVIDER_SITE_OTHER): Admitting: Ophthalmology

## 2024-06-25 ENCOUNTER — Ambulatory Visit: Payer: Medicare HMO
